# Patient Record
Sex: Male | Born: 1970 | Race: White | Hispanic: No | Smoking: Never smoker
Health system: Southern US, Community
[De-identification: ages and names within clinical notes are randomized; demographics above are authoritative.]

## PROBLEM LIST (undated history)

## (undated) DIAGNOSIS — F909 Attention-deficit hyperactivity disorder, unspecified type: Secondary | ICD-10-CM

## (undated) DIAGNOSIS — R55 Syncope and collapse: Secondary | ICD-10-CM

## (undated) DIAGNOSIS — I471 Supraventricular tachycardia, unspecified: Secondary | ICD-10-CM

## (undated) HISTORY — PX: HERNIA REPAIR: SHX51

---

## 1998-06-06 HISTORY — PX: KNEE SURGERY: SHX244

## 2010-06-06 HISTORY — PX: CARDIAC ELECTROPHYSIOLOGY STUDY AND ABLATION: SHX1294

## 2016-07-06 ENCOUNTER — Ambulatory Visit (INDEPENDENT_AMBULATORY_CARE_PROVIDER_SITE_OTHER): Payer: Commercial Managed Care - PPO | Admitting: Physician Assistant

## 2016-07-06 ENCOUNTER — Encounter: Payer: Self-pay | Admitting: Physician Assistant

## 2016-07-06 VITALS — BP 117/64 | HR 97 | Ht 72.0 in | Wt 193.0 lb

## 2016-07-06 DIAGNOSIS — D171 Benign lipomatous neoplasm of skin and subcutaneous tissue of trunk: Secondary | ICD-10-CM | POA: Insufficient documentation

## 2016-07-06 DIAGNOSIS — F902 Attention-deficit hyperactivity disorder, combined type: Secondary | ICD-10-CM | POA: Insufficient documentation

## 2016-07-06 DIAGNOSIS — Z8679 Personal history of other diseases of the circulatory system: Secondary | ICD-10-CM

## 2016-07-06 DIAGNOSIS — R002 Palpitations: Secondary | ICD-10-CM | POA: Diagnosis not present

## 2016-07-06 MED ORDER — NEBIVOLOL HCL 2.5 MG PO TABS: 2.5000 mg | ORAL_TABLET | Freq: Every day | ORAL | 2 refills | Status: DC

## 2016-07-06 MED ORDER — BUPROPION HCL ER (SR) 150 MG PO TB12: 150.0000 mg | ORAL_TABLET | Freq: Two times a day (BID) | ORAL | 2 refills | Status: DC

## 2016-07-06 NOTE — Progress Notes (Signed)
Subjective:    Patient ID: Jacob Cohen, male    DOB: 28-Jul-1970, 46 y.o.   MRN: [id number]  HPI  Pt is a 46 yo male who presents to the clinic to establish care.   .. Active Ambulatory Problems    Diagnosis Date Noted  . Lipoma of torso 07/06/2016  . ADHD (attention deficit hyperactivity disorder), combined type 07/06/2016  . Palpitations 07/06/2016  . History of PSVT (paroxysmal supraventricular tachycardia) 07/06/2016   Resolved Ambulatory Problems    Diagnosis Date Noted  . No Resolved Ambulatory Problems   No Additional Past Medical History   .Marland KitchenNo family history on file.  .. Social History   Social History  . Marital status: Single    Spouse name: N/A  . Number of children: N/A  . Years of education: N/A   Occupational History  . Not on file.   Social History Main Topics  . Smoking status: Never Smoker  . Smokeless tobacco: Current User    Types: Chew  . Alcohol use Yes  . Drug use: No  . Sexual activity: Yes   Other Topics Concern  . Not on file   Social History Narrative  . No narrative on file   Pt has hx of SVT in 2012 and had ablation done. He was started on beta blockers after surgery due to continuing to be symptomatic. Per pt not able to ablate every area needed due to being so close to SA node. He did not like the way BB made him feel and has not been on medication in 4 years. No CP. Has frequent episodes of palpitations.   Pt has long hx of ADHD. On ritalin as a child and before ablation. He needs help focusing. He is going through a divorce which is making it worse.   He has nodules on abdomen that have started to become tender. Seem to be growing. He has had some removed before on his arm.    Review of Systems  All other systems reviewed and are negative.      Objective:   Physical Exam  Constitutional: He is oriented to person, place, and time. He appears well-developed and well-nourished.  fidgity on exam.   HENT:  Head:  Normocephalic and atraumatic.  Cardiovascular: Normal rate, regular rhythm and normal heart sounds.   Pulmonary/Chest: Effort normal and breath sounds normal. He has no wheezes.  Abdominal:    Neurological: He is alert and oriented to person, place, and time.  Psychiatric: He has a normal mood and affect. His behavior is normal.          Assessment & Plan:  Marland KitchenMarland KitchenCristos was seen today for establish care.  Diagnoses and all orders for this visit:  History of PSVT (paroxysmal supraventricular tachycardia) -     EKG 12-Lead -     nebivolol (BYSTOLIC) 2.5 MG tablet; Take 1 tablet (2.5 mg total) by mouth daily.  Palpitations -     nebivolol (BYSTOLIC) 2.5 MG tablet; Take 1 tablet (2.5 mg total) by mouth daily.  ADHD (attention deficit hyperactivity disorder), combined type -     buPROPion (WELLBUTRIN SR) 150 MG 12 hr tablet; Take 1 tablet (150 mg total) by mouth 2 (two) times daily.  Lipoma of torso -     Ambulatory referral to General Surgery   EKG in office today showed NSR and no arrhthymias or PVC's.  Will start bystolic due to hx of intolerance of beta blockers. Follow up in 2 months.  Pt is not a candidate for stimulant due to hx of SVT. Discussed wellbutrin and strattera. He would like to try Wellbutrin first.   Due to areas becoming more painful will send to general surgery to consider removal.

## 2016-08-31 ENCOUNTER — Telehealth: Payer: Self-pay | Admitting: Physician Assistant

## 2016-08-31 ENCOUNTER — Encounter: Payer: Self-pay | Admitting: Physician Assistant

## 2016-08-31 ENCOUNTER — Ambulatory Visit (INDEPENDENT_AMBULATORY_CARE_PROVIDER_SITE_OTHER): Payer: Commercial Managed Care - PPO | Admitting: Physician Assistant

## 2016-08-31 VITALS — BP 113/69 | HR 78 | Ht 72.0 in | Wt 193.0 lb

## 2016-08-31 DIAGNOSIS — R002 Palpitations: Secondary | ICD-10-CM

## 2016-08-31 DIAGNOSIS — Z131 Encounter for screening for diabetes mellitus: Secondary | ICD-10-CM

## 2016-08-31 DIAGNOSIS — Z8679 Personal history of other diseases of the circulatory system: Secondary | ICD-10-CM | POA: Diagnosis not present

## 2016-08-31 DIAGNOSIS — F902 Attention-deficit hyperactivity disorder, combined type: Secondary | ICD-10-CM | POA: Diagnosis not present

## 2016-08-31 DIAGNOSIS — Z13220 Encounter for screening for lipoid disorders: Secondary | ICD-10-CM

## 2016-08-31 MED ORDER — NEBIVOLOL HCL 2.5 MG PO TABS: 2.5000 mg | ORAL_TABLET | Freq: Every day | ORAL | 3 refills | Status: DC

## 2016-08-31 NOTE — Progress Notes (Signed)
   Subjective:    Patient ID: Jacob Cohen, male    DOB: 05-Aug-1970, 46 y.o.   MRN: [id number]  HPI  Pt is a 46 yo male who presents to the clinic for follow up on medications.   PSVT-started on bystolic and tolerating well. He has a "few flutters" once a week or so. He drinks about 1 cup of caffiene a day. He has not noticed any side effects.   ADHD- wellbutrin did not help at all for focus and made him more irritable. He would like to stop. Focus continues to be a big problem for him.    Review of Systems  All other systems reviewed and are negative.      Objective:   Physical Exam  Constitutional: He is oriented to person, place, and time. He appears well-developed and well-nourished.  HENT:  Head: Normocephalic and atraumatic.  Cardiovascular: Normal rate, regular rhythm and normal heart sounds.   Pulmonary/Chest: Effort normal and breath sounds normal.  Neurological: He is alert and oriented to person, place, and time.  Psychiatric: He has a normal mood and affect. His behavior is normal.          Assessment & Plan:  Marland KitchenMarland KitchenDiagnoses and all orders for this visit:  Screening for lipid disorders -     Lipid panel  History of PSVT (paroxysmal supraventricular tachycardia) -     nebivolol (BYSTOLIC) 2.5 MG tablet; Take 1 tablet (2.5 mg total) by mouth daily.  Palpitations -     nebivolol (BYSTOLIC) 2.5 MG tablet; Take 1 tablet (2.5 mg total) by mouth daily.  Screening for diabetes mellitus -     COMPLETE METABOLIC PANEL WITH GFR  ADHD (attention deficit hyperactivity disorder), combined type   Refilled bystolic for 1 year.  Ordered screening labs.   Stop wellbutrin since not helping ADHD and likely causing agitation.   Discussed he really is not a candidate for stimulant. Will try stratterra. Will call rep and see if starter pack is available. If not will send in strattera. Encouraged to look online for coupon discussed side effects.

## 2016-08-31 NOTE — Telephone Encounter (Signed)
Can we please call strattera rep and see if they still give samples of starter packs? If they do please see if we can get one for patient if they do not then send this in to pharmacy.   Strattera 40mg  qam for 7 days then 2 tablets (80mg ) qam. #60   Pt will need to follow up in 1 month if starts strattera.

## 2016-09-01 ENCOUNTER — Other Ambulatory Visit: Payer: Self-pay

## 2016-09-01 LAB — LIPID PANEL
CHOLESTEROL: 189 mg/dL (ref <200)
HDL: 36 mg/dL — ABNORMAL LOW (ref >40)
LDL Cholesterol: 128 mg/dL — ABNORMAL HIGH (ref <100)
Total CHOL/HDL Ratio: 5.3 Ratio — ABNORMAL HIGH (ref <5.0)
Triglycerides: 123 mg/dL (ref <150)
VLDL: 25 mg/dL (ref <30)

## 2016-09-01 LAB — COMPLETE METABOLIC PANEL WITH GFR
ALBUMIN: 4.4 g/dL (ref 3.6–5.1)
ALK PHOS: 50 U/L (ref 40–115)
ALT: 31 U/L (ref 9–46)
AST: 26 U/L (ref 10–40)
BUN: 19 mg/dL (ref 7–25)
CALCIUM: 9.3 mg/dL (ref 8.6–10.3)
CO2: 24 mmol/L (ref 20–31)
CREATININE: 1.17 mg/dL (ref 0.60–1.35)
Chloride: 104 mmol/L (ref 98–110)
GFR, Est African American: 87 mL/min (ref >=60)
GFR, Est Non African American: 75 mL/min (ref >=60)
GLUCOSE: 102 mg/dL — AB (ref 65–99)
Potassium: 4 mmol/L (ref 3.5–5.3)
SODIUM: 139 mmol/L (ref 135–146)
Total Bilirubin: 0.8 mg/dL (ref 0.2–1.2)
Total Protein: 6.9 g/dL (ref 6.1–8.1)

## 2016-09-01 MED ORDER — ATOMOXETINE HCL 40 MG PO CAPS
40.0000 mg | ORAL_CAPSULE | Freq: Every day | ORAL | 0 refills | Status: DC
Start: 2016-09-01 — End: 2017-08-23

## 2016-09-01 NOTE — Telephone Encounter (Signed)
Script put in your box for signature.

## 2016-09-01 NOTE — Progress Notes (Unsigned)
strett

## 2016-09-01 NOTE — Telephone Encounter (Signed)
Notified patient.  Script sent.

## 2016-09-01 NOTE — Telephone Encounter (Signed)
Jacob Cohen from [facility] called, there is no one actively promoting Strattera at this point.  It went off patent last year.

## 2016-09-01 NOTE — Telephone Encounter (Signed)
Ok please make patient aware and then send in rx. Follow up in 1 month to see how working.

## 2017-08-21 ENCOUNTER — Ambulatory Visit: Payer: Commercial Managed Care - PPO | Admitting: Physician Assistant

## 2017-08-23 ENCOUNTER — Ambulatory Visit (INDEPENDENT_AMBULATORY_CARE_PROVIDER_SITE_OTHER): Payer: BLUE CROSS/BLUE SHIELD | Admitting: Physician Assistant

## 2017-08-23 ENCOUNTER — Encounter: Payer: Self-pay | Admitting: Physician Assistant

## 2017-08-23 VITALS — BP 130/78 | HR 86 | Ht 72.0 in | Wt 189.0 lb

## 2017-08-23 DIAGNOSIS — F902 Attention-deficit hyperactivity disorder, combined type: Secondary | ICD-10-CM | POA: Diagnosis not present

## 2017-08-23 DIAGNOSIS — Z8679 Personal history of other diseases of the circulatory system: Secondary | ICD-10-CM | POA: Diagnosis not present

## 2017-08-23 DIAGNOSIS — Z9889 Other specified postprocedural states: Secondary | ICD-10-CM | POA: Diagnosis not present

## 2017-08-23 NOTE — Progress Notes (Signed)
   Subjective:    Patient ID: Jacob Cohen, male    DOB: 03-Oct-1970, 47 y.o.   MRN: [id number]  HPI Jacob Cohen is a 47 year old male who presents today to discuss medication changes for ADHD. He has been treated since adolescence for ADHD with stimulants.  Patient has tried Welbutrin and Oncologist with no improvement in symptoms. He was last prescribed Strattera about 1 year ago. He took it for approximately 1 month and d/c it himself as he was having no improvement. Over the past 11 months he has "just been dealing with it". He states that he took one of his brothers adderall 20mg  tablets last week and he had an extremely productive day. He has a history of PSVT with ablation in 2012. He has occasional irregular beats (maybe 1 each month or less) that do not occur with exercise or stress. He denies any chest pain, shortness of breath, blurry vision, headaches. He does not regularly check his blood pressure and last time he checked it was a couple months ago and it was ~110/66 (per patient). He was previously on bystolic but stopped it because he thought it was helping and he feels no different on beta blocker than off.   .. Active Ambulatory Problems    Diagnosis Date Noted  . Lipoma of torso 07/06/2016  . ADHD (attention deficit hyperactivity disorder), combined type 07/06/2016  . Palpitations 07/06/2016  . History of PSVT (paroxysmal supraventricular tachycardia) 07/06/2016   Resolved Ambulatory Problems    Diagnosis Date Noted  . No Resolved Ambulatory Problems   No Additional Past Medical History      Review of Systems  Constitutional: Negative for chills and fever.  Respiratory: Negative for chest tightness and shortness of breath.   Cardiovascular: Negative for chest pain and palpitations.  Gastrointestinal: Negative for abdominal pain, nausea and vomiting.  Neurological: Negative for dizziness, syncope, [name] and headaches.       Objective:   Physical Exam   Constitutional: He is oriented to person, place, and time. He appears well-developed and well-nourished.  HENT:  Head: Normocephalic and atraumatic.  Eyes: Conjunctivae are normal.  Cardiovascular: Normal rate, regular rhythm and normal heart sounds.  Neurological: He is alert and oriented to person, place, and time.  Skin: Skin is warm and dry.  Psychiatric: He has a normal mood and affect. His behavior is normal.          Assessment & Plan:  Marland KitchenMarland KitchenGarlen was seen today for adhd.  Diagnoses and all orders for this visit:  ADHD (attention deficit hyperactivity disorder), combined type  History of PSVT (paroxysmal supraventricular tachycardia) -     Ambulatory referral to Cardiology  S/P ablation operation for arrhythmia -     Ambulatory referral to Cardiology   Pt is wanting to start adderall. I would like to get second opinion by cardiology first. Referral to be made. Pt has had ablation and not on beta blocker and not symptomatic. I think would be ok to start at 10mg  daily and see how it goes. Will defer to cardiology for decision. BP on 2nd recheck much better. Would like to see a few weeks of BP logs to make sure BP's look good on a regular basis. Follow up after starting adderall.

## 2017-08-23 NOTE — Progress Notes (Deleted)
   Subjective:    Patient ID: Jacob Cohen, male    DOB: April 27, 1971, 47 y.o.   MRN: [id number]  HPI  ASAP cardiology.  Keep BP.   Review of Systems     Objective:   Physical Exam        Assessment & Plan:

## 2017-09-13 ENCOUNTER — Encounter: Payer: Self-pay | Admitting: Physician Assistant

## 2017-09-13 ENCOUNTER — Ambulatory Visit (INDEPENDENT_AMBULATORY_CARE_PROVIDER_SITE_OTHER): Payer: BLUE CROSS/BLUE SHIELD | Admitting: Physician Assistant

## 2017-09-13 VITALS — BP 120/90 | HR 73 | Ht 72.0 in | Wt 190.8 lb

## 2017-09-13 DIAGNOSIS — I519 Heart disease, unspecified: Secondary | ICD-10-CM | POA: Insufficient documentation

## 2017-09-13 DIAGNOSIS — E785 Hyperlipidemia, unspecified: Secondary | ICD-10-CM

## 2017-09-13 DIAGNOSIS — Z8679 Personal history of other diseases of the circulatory system: Secondary | ICD-10-CM | POA: Diagnosis not present

## 2017-09-13 DIAGNOSIS — F909 Attention-deficit hyperactivity disorder, unspecified type: Secondary | ICD-10-CM | POA: Diagnosis not present

## 2017-09-13 LAB — TSH: TSH: 1.32 u[IU]/mL (ref 0.450–4.500)

## 2017-09-13 NOTE — Patient Instructions (Signed)
Medication Instructions:  Your physician recommends that you continue on your current medications as directed. Please refer to the Current Medication list given to you today.  Labwork: Your physician recommends that you return for lab work in: TODAY-TSH  Testing/Procedures: NONE   Follow-Up: Follow up as needed  Any Other Special Instructions Will Be Listed Below (If Applicable). If you need a refill on your cardiac medications before your next appointment, please call your pharmacy.

## 2017-09-13 NOTE — Progress Notes (Signed)
Cardiology Office Note    Date:  09/13/2017   ID:  Jacob Cohen, DOB [birth year], MRN [id number]  PCP:  Donella Stade, PA-C  Cardiologist:  New- Case discussed with Dr. Quay Burow   Chief Complaint  Patient presents with  . New Patient (Initial Visit)    cardiac clearance to start on Adderall. Patient referred by Iran Planas PA-C    History of Present Illness:  Jacob Cohen is a pleasant 47 y.o. male with PMH of ADHD and h/o PSVT s/p ablation 2012.  According to the patient, he was very symptomatic with PSVT in the past and had a few episodes of presyncope associated with fast heart rate.  He had the recurrent PSVT for 1.5 years before he sought ablation in 2012 at [facility]. Afterward, he did have a few recurrence of the symptoms, he was initially placed on metoprolol, he later discontinue the metoprolol due to the side effect of fatigue.  Nowaday, he only feels occasional fluttering sensation once every few months and they would only last about a second.  He is no longer having severe dizziness with recurrent episode.  He denies any chest pain, he works about 6 days a week and run up to 25 miles per week.  He denies any exertional shortness of breath either.  On physical exam, he does not have any obvious heart murmur, he does not have any lower extremity edema, orthopnea or PND.  Apparently he has failed Ritalin in the past, however he recently tried Adderall which was working very well for him.   I have discussed his case with DOD Dr. Donnella Bi, given the fact that ADHD is interfering with his daily life, and Adderall seems to help, our recommendation is for him to start on Adderall and if it does causes more PSVT in the future, cardiology would be available to help manage the PSVT.  No additional cardiology workup is needed in this case.  I will however obtain a TSH to check his thyroid as hyperthyroidism can cause more PSVT.  Otherwise recent lab work does show  that his cholesterol is mildly elevated, however given how much he exercises on a weekly basis.  I would recommend continued diet and exercise for now instead of initiating statins.    Past Medical History:  Diagnosis Date  . Heart disease     Past Surgical History:  Procedure Laterality Date  . CARDIAC ELECTROPHYSIOLOGY STUDY AND ABLATION  2012  . HERNIA REPAIR    . KNEE SURGERY Left 2000    Current Medications: Outpatient Medications Prior to Visit  Medication Sig Dispense Refill  . Ascorbic Acid (VITAMIN C PO) Take by mouth.    Marland Kitchen aspirin EC 81 MG tablet Take 81 mg by mouth daily.    . Multiple Vitamins-Minerals (ZINC PO) Take by mouth.     No facility-administered medications prior to visit.      Allergies:   Patient has no known allergies.   Social History   Socioeconomic History  . Marital status: Single    Spouse name: Not on file  . Number of children: Not on file  . Years of education: Not on file  . Highest education level: Not on file  Occupational History  . Not on file  Social Needs  . Financial resource strain: Not on file  . Food insecurity:    Worry: Not on file    Inability: Not on file  . Transportation needs:  Medical: Not on file    Non-medical: Not on file  Tobacco Use  . Smoking status: Never Smoker  . Smokeless tobacco: Current User    Types: Chew  Substance and Sexual Activity  . Alcohol use: Yes    Comment: 2-4 beers on the weekend  . Drug use: No  . Sexual activity: Yes  Lifestyle  . Physical activity:    Days per week: Not on file    Minutes per session: Not on file  . Stress: Not on file  Relationships  . Social connections:    Talks on phone: Not on file    Gets together: Not on file    Attends religious service: Not on file    Active member of club or organization: Not on file    Attends meetings of clubs or organizations: Not on file    Relationship status: Not on file  Other Topics Concern  . Not on file  Social  History Narrative  . Not on file     Family History:  The patient's family history includes Alzheimer's disease in his maternal grandfather; COPD in his paternal grandfather; Cancer in his maternal grandmother.   ROS:   Please see the history of present illness.    ROS All other systems reviewed and are negative.   PHYSICAL EXAM:   VS:  BP 120/90 (BP Location: Right Arm)   Pulse 73   Ht 6' (1.829 m)   Wt 190 lb 12.8 oz (86.5 kg)   BMI 25.88 kg/m    GEN: Well nourished, well developed, in no acute distress  HEENT: normal  Neck: no JVD, carotid bruits, or masses Cardiac: RRR; no murmurs, rubs, or gallops,no edema  Respiratory:  clear to auscultation bilaterally, normal work of breathing GI: soft, nontender, nondistended, + BS MS: no deformity or atrophy  Skin: warm and dry, no rash Neuro:  Alert and Oriented x 3, Strength and sensation are intact Psych: euthymic mood, full affect  Wt Readings from Last 3 Encounters:  09/13/17 190 lb 12.8 oz (86.5 kg)  08/23/17 189 lb (85.7 kg)  08/31/16 193 lb (87.5 kg)      Studies/Labs Reviewed:   EKG:  EKG is ordered today.  The ekg ordered today demonstrates normal sinus rhythm without significant ST-T wave changes  Recent Labs: No results found for requested labs within last 8760 hours.   Lipid Panel    Component Value Date/Time   CHOL 189 08/31/2016 1005   TRIG 123 08/31/2016 1005   HDL 36 (L) 08/31/2016 1005   CHOLHDL 5.3 (H) 08/31/2016 1005   VLDL 25 08/31/2016 1005   LDLCALC 128 (H) 08/31/2016 1005    Additional studies/ records that were reviewed today include:   N/A   ASSESSMENT:    1. History of PSVT (paroxysmal supraventricular tachycardia)   2. Attention deficit hyperactivity disorder (ADHD), unspecified ADHD type   3. Hyperlipidemia, unspecified hyperlipidemia type      PLAN:  In order of problems listed above:  1. History of PSVT s/p ablation at the [facility] in 2012: Patient only has  very rare recurrence of palpitation, and if the episodes only last about a second.  From cardiology standpoint, if Adderall is giving him more benefit to manage his ADHD, there is no contraindication from cardiology side.  Since Adderall is a stimulant, there is a probability it will increase recurrence of PSVT.  Cardiology will be available if PSVT does become more frequent.  I will  obtain TSH today to check his thyroid.  Right now he drink up to 4 beers every weekend, increased alcohol consumption sometimes can also increase probability of recurrent PSVT.  If he does have increased recurrence, I would recommend cut back on alcohol and potentially use low dose diltiazem CD to help manage this symptom.  Note the patient had side effect with beta-blocker before.  2. ADHD: Will defer to primary care service, known contraindication from cardiology perspective to start on Adderall if needed.  3. Hyperlipidemia: Seen on the recent lab work, LDL was 128, HDL 36.  Unclear why his HDL is this low despite the patient says he ran out to 25 miles per week.  We will hold off on initiating statins, encourage diet and exercise in this young patient who does not have significant history of coronary artery disease.    Medication Adjustments/Labs and Tests Ordered: Current medicines are reviewed at length with the patient today.  Concerns regarding medicines are outlined above.  Medication changes, Labs and Tests ordered today are listed in the Patient Instructions below. Patient Instructions  Medication Instructions:  Your physician recommends that you continue on your current medications as directed. Please refer to the Current Medication list given to you today.  Labwork: Your physician recommends that you return for lab work in: TODAY-TSH  Testing/Procedures: NONE   Follow-Up: Follow up as needed  Any Other Special Instructions Will Be Listed Below (If Applicable). If you need a refill on your cardiac  medications before your next appointment, please call your pharmacy.     Hilbert Corrigan, [place]  09/13/2017 10:09 AM    [address], [place], [place]  [address] Phone: [phone]; Fax: [phone]

## 2017-09-14 ENCOUNTER — Other Ambulatory Visit: Payer: Self-pay | Admitting: Physician Assistant

## 2017-09-14 ENCOUNTER — Telehealth: Payer: Self-pay | Admitting: Physician Assistant

## 2017-09-14 MED ORDER — AMPHETAMINE-DEXTROAMPHET ER 20 MG PO CP24: 20.0000 mg | ORAL_CAPSULE | ORAL | 0 refills | Status: DC

## 2017-09-14 NOTE — Telephone Encounter (Signed)
Patient was seen by cardiology yesterday and is inquiring about starting the med you and he discussed at his last appointment. He would like to start with the 20mg . Please advise. Thanks!

## 2017-09-14 NOTE — Telephone Encounter (Signed)
OK sent! Follow up in 1 month for recheck.

## 2017-09-15 ENCOUNTER — Telehealth: Payer: Self-pay | Admitting: Physician Assistant

## 2017-09-15 NOTE — Telephone Encounter (Signed)
New Message:     Pt returning call for results of labs

## 2017-09-15 NOTE — Telephone Encounter (Signed)
Follow up ° ° °Patient is returning call for results. °

## 2017-09-15 NOTE — Telephone Encounter (Signed)
Patient aware and verbalized understanding. °

## 2017-09-15 NOTE — Telephone Encounter (Signed)
Received fax from [place] that Adderall  was approved from 09/15/2017 through 09/16/2018. Pharmacy notified and forms sent to scan.   Reference ID: [id number].

## 2017-10-17 ENCOUNTER — Other Ambulatory Visit: Payer: Self-pay

## 2017-10-17 MED ORDER — AMPHETAMINE-DEXTROAMPHET ER 20 MG PO CP24: 20.0000 mg | ORAL_CAPSULE | ORAL | 0 refills | Status: DC

## 2017-10-17 NOTE — Telephone Encounter (Signed)
PT needs a refill on Aderrall he's out of it as of today

## 2017-10-18 ENCOUNTER — Ambulatory Visit (INDEPENDENT_AMBULATORY_CARE_PROVIDER_SITE_OTHER): Payer: BLUE CROSS/BLUE SHIELD | Admitting: Physician Assistant

## 2017-10-18 ENCOUNTER — Encounter: Payer: Self-pay | Admitting: Physician Assistant

## 2017-10-18 VITALS — BP 119/62 | HR 86 | Ht 72.0 in | Wt 181.0 lb

## 2017-10-18 DIAGNOSIS — F902 Attention-deficit hyperactivity disorder, combined type: Secondary | ICD-10-CM | POA: Diagnosis not present

## 2017-10-18 MED ORDER — AMPHETAMINE-DEXTROAMPHET ER 30 MG PO CP24: 30.0000 mg | ORAL_CAPSULE | ORAL | 0 refills | Status: DC

## 2017-10-18 NOTE — Progress Notes (Signed)
   Subjective:    Patient ID: Jacob Cohen, male    DOB: July 31, 1970, 47 y.o.   MRN: [id number]  HPI Pt is a 47 yo male who presents to the clinic for ADHD.   Patient has noticed a ton of benefit starting Adderall.  He denies any increase in palpitations, headaches, anxiety, insomnia.  He still feels like it only last about 5 hours.  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  He wishes to have a little more extended release.  .. Active Ambulatory Problems    Diagnosis Date Noted  . Lipoma of torso 07/06/2016  . ADHD (attention deficit hyperactivity disorder), combined type 07/06/2016  . Palpitations 07/06/2016  . History of PSVT (paroxysmal supraventricular tachycardia) 07/06/2016  . Heart disease    Resolved Ambulatory Problems    Diagnosis Date Noted  . No Resolved Ambulatory Problems   Past Medical History:  Diagnosis Date  . Heart disease       Review of Systems  All other systems reviewed and are negative.      Objective:   Physical Exam  Constitutional: He is oriented to person, place, and time. He appears well-developed and well-nourished.  HENT:  Head: Normocephalic and atraumatic.  Cardiovascular: Normal rate and regular rhythm.  Pulmonary/Chest: Effort normal and breath sounds normal.  Neurological: He is alert and oriented to person, place, and time.  Psychiatric: He has a normal mood and affect. His behavior is normal.          Assessment & Plan:  Marland KitchenMarland KitchenLamon was seen today for adhd.  Diagnoses and all orders for this visit:  ADHD (attention deficit hyperactivity disorder), combined type -     amphetamine-dextroamphetamine (ADDERALL XR) 30 MG 24 hr capsule; Take 1 capsule (30 mg total) by mouth every morning.   Increase to 30 mg.  Discussed he could go up to 60 mg if needed.  I would rather keep him on extended release today and add a afternoon immediate release.  Let me know via my chart in 1 month how he is doing on this current dose and  if he needs refills the same or if we need to change medication a little.

## 2017-11-16 ENCOUNTER — Telehealth: Payer: Self-pay

## 2017-11-16 DIAGNOSIS — F902 Attention-deficit hyperactivity disorder, combined type: Secondary | ICD-10-CM

## 2017-11-16 NOTE — Telephone Encounter (Signed)
PT came in office and asked for a refill on Adderall he would like to go up to 40mg . Pharmacy on file is correct.

## 2017-11-17 MED ORDER — AMPHETAMINE-DEXTROAMPHET ER 30 MG PO CP24: 30.0000 mg | ORAL_CAPSULE | ORAL | 0 refills | Status: DC

## 2017-11-17 MED ORDER — AMPHETAMINE-DEXTROAMPHET ER 10 MG PO CP24: 10.0000 mg | ORAL_CAPSULE | Freq: Every day | ORAL | 0 refills | Status: DC

## 2017-11-17 NOTE — Telephone Encounter (Signed)
Advise patient that I sent 30mg  XR and 60mXR to equal 40 since there is no 40mg  XR.

## 2017-11-17 NOTE — Telephone Encounter (Signed)
Pt advised. No further needs at this time 

## 2017-12-08 ENCOUNTER — Ambulatory Visit (INDEPENDENT_AMBULATORY_CARE_PROVIDER_SITE_OTHER): Payer: BLUE CROSS/BLUE SHIELD | Admitting: Physician Assistant

## 2017-12-08 ENCOUNTER — Encounter: Payer: Self-pay | Admitting: Physician Assistant

## 2017-12-08 VITALS — BP 145/82 | HR 70 | Ht 72.01 in | Wt 180.0 lb

## 2017-12-08 DIAGNOSIS — B360 Pityriasis versicolor: Secondary | ICD-10-CM | POA: Diagnosis not present

## 2017-12-08 DIAGNOSIS — F902 Attention-deficit hyperactivity disorder, combined type: Secondary | ICD-10-CM

## 2017-12-08 MED ORDER — KETOCONAZOLE 2 % EX CREA: 1.0000 "application " | TOPICAL_CREAM | Freq: Two times a day (BID) | CUTANEOUS | 1 refills | Status: DC

## 2017-12-08 MED ORDER — AMPHETAMINE-DEXTROAMPHET ER 30 MG PO CP24: 30.0000 mg | ORAL_CAPSULE | ORAL | 0 refills | Status: DC

## 2017-12-08 MED ORDER — FLUCONAZOLE 150 MG PO TABS: ORAL_TABLET | ORAL | 0 refills | Status: DC

## 2017-12-08 MED ORDER — AMPHETAMINE-DEXTROAMPHET ER 10 MG PO CP24: 10.0000 mg | ORAL_CAPSULE | Freq: Every day | ORAL | 0 refills | Status: DC

## 2017-12-08 NOTE — Patient Instructions (Addendum)
Tinea Versicolor Tinea versicolor is a common fungal infection of the skin. It causes a rash that appears as light or dark patches on the skin. The rash most often occurs on the chest, back, neck, or upper arms. This condition is more common during warm weather. Other than affecting how your skin looks, tinea versicolor usually does not cause other problems. In most cases, the infection goes away in a few weeks with treatment. It may take a few months for the patches on your skin to clear up. What are the causes? Tinea versicolor occurs when a type of fungus that is normally present on the skin starts to overgrow. This fungus is a kind of yeast. The exact cause of the overgrowth is not known. This condition cannot be passed from one person to another (noncontagious). What increases the risk? This condition is more likely to develop when certain factors are present, such as:  Heat and humidity.  Sweating too much.  Hormone changes.  Oily skin.  A weak defense (immune) system.  What are the signs or symptoms? Symptoms of this condition may include:  A rash on your skin that is made up of light or dark patches. The rash may have: ? Patches of tan or pink spots on light skin. ? Patches of white or brown spots on dark skin. ? Patches of skin that do not tan. ? Well-marked edges. ? Scales on the discolored areas.  Mild itching.  How is this diagnosed? A health care provider can usually diagnose this condition by looking at your skin. During the exam, he or she may use ultraviolet light to help determine the extent of the infection. In some cases, a skin sample may be taken by scraping the rash. This sample will be viewed under a microscope to check for yeast overgrowth. How is this treated? Treatment for this condition may include:  Dandruff shampoo that is applied to the affected skin during showers or bathing.  Over-the-counter medicated skin cream, lotion, or soaps.  Prescription  antifungal medicine in the form of skin cream or pills.  Medicine to help reduce itching.  Follow these instructions at home:  Take medicines only as directed by your health care provider.  Apply dandruff shampoo to the affected area if told to do so by your health care provider. You may be instructed to scrub the affected skin for several minutes each day.  Do not scratch the affected area of skin.  Avoid hot and humid conditions.  Do not use tanning booths.  Try to avoid sweating a lot. Contact a health care provider if:  Your symptoms get worse.  You have a fever.  You have redness, swelling, or pain at the site of your rash.  You have fluid, blood, or pus coming from your rash.  Your rash returns after treatment. This information is not intended to replace advice given to you by your health care provider. Make sure you discuss any questions you have with your health care provider. Document Released: 05/20/2000 Document Revised: 01/24/2016 Document Reviewed: 03/04/2014 Elsevier Interactive Patient Education  2018 [employer] versicolor is a skin infection that is caused by a type of yeast. It causes a rash that shows up as light or dark patches on the skin. It often occurs on the chest, back, neck, or upper arms. The condition usually does not cause other problems. In most cases, it goes away in a few weeks with treatment. The infection cannot be spread by  person to another person. Follow these instructions at home:  Take medicines only as told by your doctor.  Scrub your skin every day with a dandruff shampoo as told by your doctor.  Do not scratch your skin in the rash area.  Avoid places that are hot and humid.  Do not use tanning booths.  Try to avoid sweating a lot. Contact a doctor if:  Your symptoms get worse.  You have a fever.  You have redness, swelling, or pain in the area of your rash.  You have fluid, blood, or pus coming  from your rash.  Your rash comes back after treatment. This information is not intended to replace advice given to you by your health care provider. Make sure you discuss any questions you have with your health care provider. Document Released: 05/05/2008 Document Revised: 01/24/2016 Document Reviewed: 03/04/2014 Elsevier Interactive Patient Education  2018 [employer].

## 2017-12-08 NOTE — Progress Notes (Signed)
   Subjective:    Patient ID: Jacob Cohen, male    DOB: 20-Jul-1970, 47 y.o.   MRN: [id number]  HPI  Pt is a 47 yo male who presents to the clinic with rash that has felt like it has been coming and going for 9 months. Today he can still see the rash without being in a flare. A flare is caused by heat.when he golfs the rash is bad but goes away after taking shirt off and 20 minutes in aircondition. No pain or itching. Rash only on trunk and bilateral arm. Not on face or down legs. Pt got some OTC fungal cream and seemed to help a little.   Doing well on adderall. No problems or concerns. In need for refill.  .. Active Ambulatory Problems    Diagnosis Date Noted  . Lipoma of torso 07/06/2016  . ADHD (attention deficit hyperactivity disorder), combined type 07/06/2016  . Palpitations 07/06/2016  . History of PSVT (paroxysmal supraventricular tachycardia) 07/06/2016  . Heart disease   . Tinea versicolor 12/08/2017   Resolved Ambulatory Problems    Diagnosis Date Noted  . No Resolved Ambulatory Problems   Past Medical History:  Diagnosis Date  . Heart disease          Review of Systems See HPI.     Objective:   Physical Exam  Constitutional: He is oriented to person, place, and time. He appears well-developed and well-nourished.  HENT:  Head: Normocephalic and atraumatic.  Cardiovascular: Normal rate and regular rhythm.  Pulmonary/Chest: Effort normal and breath sounds normal.  Neurological: He is alert and oriented to person, place, and time.  Skin:  hyperpigmented rash of macules and patches with irregular borders and some had some central clearning located over his right flank area but scattered over entire upper/lower/mid back.   Psychiatric: He has a normal mood and affect. His behavior is normal.          Assessment & Plan:  Marland KitchenMarland KitchenTobin was seen today for rash.  Diagnoses and all orders for this visit:  Tinea versicolor -     fluconazole (DIFLUCAN) 150 MG  tablet; Take 2 tablets once a week for 4 weeks. -     ketoconazole (NIZORAL) 2 % cream; Apply 1 application topically 2 (two) times daily. To affected areas.  ADHD (attention deficit hyperactivity disorder), combined type -     amphetamine-dextroamphetamine (ADDERALL XR) 30 MG 24 hr capsule; Take 1 capsule (30 mg total) by mouth every morning. -     amphetamine-dextroamphetamine (ADDERALL XR) 10 MG 24 hr capsule; Take 1 capsule (10 mg total) by mouth daily.   Gave HO. From the rash I see today classic tinea. Discussed prevention. Given diflucan for 1 month at a weekly dose and ketoconazole cream. Follow up if no improvement.

## 2018-01-19 ENCOUNTER — Telehealth: Payer: Self-pay | Admitting: Physician Assistant

## 2018-01-19 DIAGNOSIS — F902 Attention-deficit hyperactivity disorder, combined type: Secondary | ICD-10-CM

## 2018-01-19 NOTE — Telephone Encounter (Signed)
PT came in requesting refill on medication (Adderall XR).   Please advise.

## 2018-01-22 ENCOUNTER — Other Ambulatory Visit: Payer: Self-pay | Admitting: Family Medicine

## 2018-01-22 DIAGNOSIS — F902 Attention-deficit hyperactivity disorder, combined type: Secondary | ICD-10-CM

## 2018-01-22 MED ORDER — AMPHETAMINE-DEXTROAMPHET ER 30 MG PO CP24: 30.0000 mg | ORAL_CAPSULE | ORAL | 0 refills | Status: DC

## 2018-01-22 MED ORDER — AMPHETAMINE-DEXTROAMPHET ER 10 MG PO CP24: 10.0000 mg | ORAL_CAPSULE | Freq: Every day | ORAL | 0 refills | Status: DC

## 2018-01-22 NOTE — Telephone Encounter (Signed)
Refill sent.

## 2018-01-22 NOTE — Addendum Note (Signed)
Addended by: Beatrice Lecher D on: 01/22/2018 10:25 AM   Modules accepted: Orders

## 2018-01-23 ENCOUNTER — Other Ambulatory Visit: Payer: Self-pay | Admitting: Family Medicine

## 2018-01-23 DIAGNOSIS — F902 Attention-deficit hyperactivity disorder, combined type: Secondary | ICD-10-CM

## 2018-01-23 MED ORDER — AMPHETAMINE-DEXTROAMPHET ER 30 MG PO CP24: 30.0000 mg | ORAL_CAPSULE | ORAL | 0 refills | Status: DC

## 2018-01-23 NOTE — Progress Notes (Signed)
Adderall XR are recent electronically as it was accidentally printed earlier today.

## 2018-02-15 ENCOUNTER — Encounter: Payer: Self-pay | Admitting: Physician Assistant

## 2018-02-15 DIAGNOSIS — F902 Attention-deficit hyperactivity disorder, combined type: Secondary | ICD-10-CM

## 2018-02-20 MED ORDER — AMPHETAMINE-DEXTROAMPHET ER 30 MG PO CP24: 30.0000 mg | ORAL_CAPSULE | ORAL | 0 refills | Status: DC

## 2018-02-20 MED ORDER — AMPHETAMINE-DEXTROAMPHETAMINE 30 MG PO TABS: ORAL_TABLET | ORAL | 0 refills | Status: DC

## 2018-02-21 DIAGNOSIS — R55 Syncope and collapse: Secondary | ICD-10-CM | POA: Diagnosis not present

## 2018-02-21 DIAGNOSIS — Z8679 Personal history of other diseases of the circulatory system: Secondary | ICD-10-CM | POA: Diagnosis not present

## 2018-02-21 DIAGNOSIS — R002 Palpitations: Secondary | ICD-10-CM | POA: Diagnosis not present

## 2018-02-21 DIAGNOSIS — I499 Cardiac arrhythmia, unspecified: Secondary | ICD-10-CM | POA: Diagnosis not present

## 2018-02-21 DIAGNOSIS — Z79899 Other long term (current) drug therapy: Secondary | ICD-10-CM | POA: Diagnosis not present

## 2018-02-21 DIAGNOSIS — I471 Supraventricular tachycardia: Secondary | ICD-10-CM | POA: Diagnosis not present

## 2018-02-21 DIAGNOSIS — F909 Attention-deficit hyperactivity disorder, unspecified type: Secondary | ICD-10-CM | POA: Diagnosis not present

## 2018-02-21 DIAGNOSIS — F17290 Nicotine dependence, other tobacco product, uncomplicated: Secondary | ICD-10-CM | POA: Diagnosis not present

## 2018-03-23 ENCOUNTER — Telehealth: Payer: Self-pay

## 2018-03-23 DIAGNOSIS — F902 Attention-deficit hyperactivity disorder, combined type: Secondary | ICD-10-CM

## 2018-03-23 DIAGNOSIS — I471 Supraventricular tachycardia: Secondary | ICD-10-CM | POA: Diagnosis not present

## 2018-03-23 DIAGNOSIS — R55 Syncope and collapse: Secondary | ICD-10-CM | POA: Diagnosis not present

## 2018-03-23 MED ORDER — AMPHETAMINE-DEXTROAMPHET ER 30 MG PO CP24: 30.0000 mg | ORAL_CAPSULE | ORAL | 0 refills | Status: DC

## 2018-03-23 NOTE — Telephone Encounter (Signed)
Done

## 2018-03-23 NOTE — Telephone Encounter (Signed)
PT came in to the office asking for a refill to be sent for his adderall. Pharmacy on file is correct.

## 2018-03-23 NOTE — Telephone Encounter (Signed)
Routing to covering Provider, refill due

## 2018-03-26 DIAGNOSIS — I493 Ventricular premature depolarization: Secondary | ICD-10-CM | POA: Diagnosis not present

## 2018-03-26 DIAGNOSIS — R000 Tachycardia, unspecified: Secondary | ICD-10-CM | POA: Diagnosis not present

## 2018-03-27 ENCOUNTER — Encounter: Payer: Self-pay | Admitting: Physician Assistant

## 2018-03-27 MED ORDER — AMPHETAMINE-DEXTROAMPHETAMINE 20 MG PO TABS: ORAL_TABLET | ORAL | 0 refills | Status: DC

## 2018-03-27 MED ORDER — AMPHETAMINE-DEXTROAMPHETAMINE 30 MG PO TABS: ORAL_TABLET | ORAL | 0 refills | Status: DC

## 2018-03-30 MED ORDER — METOPROLOL SUCCINATE ER 25 MG PO TB24: ORAL_TABLET | ORAL | 0 refills | Status: DC

## 2018-03-30 NOTE — Addendum Note (Signed)
Addended by: Donella Stade on: 03/30/2018 10:32 AM   Modules accepted: Orders

## 2018-04-21 ENCOUNTER — Other Ambulatory Visit: Payer: Self-pay | Admitting: Physician Assistant

## 2018-04-24 ENCOUNTER — Ambulatory Visit (INDEPENDENT_AMBULATORY_CARE_PROVIDER_SITE_OTHER): Payer: BLUE CROSS/BLUE SHIELD | Admitting: Physician Assistant

## 2018-04-24 ENCOUNTER — Encounter: Payer: Self-pay | Admitting: Physician Assistant

## 2018-04-24 ENCOUNTER — Other Ambulatory Visit: Payer: Self-pay | Admitting: Sports Medicine

## 2018-04-24 ENCOUNTER — Other Ambulatory Visit: Payer: Self-pay | Admitting: Physician Assistant

## 2018-04-24 VITALS — BP 129/80 | HR 101 | Ht 72.01 in | Wt 180.0 lb

## 2018-04-24 DIAGNOSIS — G8929 Other chronic pain: Secondary | ICD-10-CM | POA: Diagnosis not present

## 2018-04-24 DIAGNOSIS — F902 Attention-deficit hyperactivity disorder, combined type: Secondary | ICD-10-CM

## 2018-04-24 DIAGNOSIS — M7121 Synovial cyst of popliteal space [Baker], right knee: Secondary | ICD-10-CM | POA: Insufficient documentation

## 2018-04-24 DIAGNOSIS — M25561 Pain in right knee: Secondary | ICD-10-CM | POA: Diagnosis not present

## 2018-04-24 MED ORDER — AMPHETAMINE-DEXTROAMPHETAMINE 30 MG PO TABS: ORAL_TABLET | ORAL | 0 refills | Status: DC

## 2018-04-24 MED ORDER — AMPHETAMINE-DEXTROAMPHET ER 30 MG PO CP24: 30.0000 mg | ORAL_CAPSULE | ORAL | 0 refills | Status: DC

## 2018-04-24 NOTE — Progress Notes (Signed)
   Subjective:    Patient ID: Jacob Cohen, male    DOB: 1970/09/07, 47 y.o.   MRN: [id number]  HPI  Patient is a 47 year old male who presents to the clinic with right posterior knee pain since April 2019.  He denies any known trauma.  He is very active.  He has had many knee injuries in the past.  He feels like the lump behind his right knee is getting bigger.  Exercise and sitting for long periods of time tends to make it worse.  Ibuprofen helps minimally.  Pt has ADHD. He is doing well on current dose 30mg  XR and 30mg  IR. He denies any problems or concerns. He is doing well at work and with production.   .. Active Ambulatory Problems    Diagnosis Date Noted  . Lipoma of torso 07/06/2016  . ADHD (attention deficit hyperactivity disorder), combined type 07/06/2016  . Palpitations 07/06/2016  . History of PSVT (paroxysmal supraventricular tachycardia) 07/06/2016  . Heart disease   . Tinea versicolor 12/08/2017  . Baker's cyst, right 04/24/2018   Resolved Ambulatory Problems    Diagnosis Date Noted  . No Resolved Ambulatory Problems   No Additional Past Medical History      Review of Systems See HPI.     Objective:   Physical Exam  Constitutional: He is oriented to person, place, and time. He appears well-developed and well-nourished.  HENT:  Head: Normocephalic and atraumatic.  Cardiovascular: Normal rate and regular rhythm.  Pulmonary/Chest: Effort normal and breath sounds normal.  Musculoskeletal:  Right knee: No swelling or bruising. Firm posterior medial cyst of knee.  Mild tenderness to palpation of cyst.  NROM  Strength 5/5.   Neurological: He is alert and oriented to person, place, and time.  Psychiatric: He has a normal mood and affect.          Assessment & Plan:  Marland KitchenMarland KitchenDiagnoses and all orders for this visit:  Baker's cyst, right -     DG Knee Complete 4 Views Right; Future -     DG Knee 1-2 Views Left; Future  Chronic pain of right knee  ADHD  (attention deficit hyperactivity disorder), combined type -     amphetamine-dextroamphetamine (ADDERALL XR) 30 MG 24 hr capsule; Take 1 capsule (30 mg total) by mouth every morning. -     amphetamine-dextroamphetamine (ADDERALL) 30 MG tablet; Take at 2pm every day. -     amphetamine-dextroamphetamine (ADDERALL XR) 30 MG 24 hr capsule; Take 1 capsule (30 mg total) by mouth every morning. -     amphetamine-dextroamphetamine (ADDERALL XR) 30 MG 24 hr capsule; Take 1 capsule (30 mg total) by mouth every morning. -     amphetamine-dextroamphetamine (ADDERALL) 30 MG tablet; Take at 2pm. -     amphetamine-dextroamphetamine (ADDERALL) 30 MG tablet; Take at 2pm.   Consulted Dr. Darene Lamer for baker cyst removal and drainage. See note.   Adderall refilled for 3 months.   Follow up in 3 months.

## 2018-04-24 NOTE — Patient Instructions (Signed)
Baker Cyst A Baker cyst, also called a popliteal cyst, is a sac-like growth that forms at the back of the knee. The cyst forms when the fluid-filled sac (bursa) that cushions the knee joint becomes enlarged. The bursa that becomes a Baker cyst is located at the back of the knee joint. What are the causes? In most cases, a Baker cyst results from another knee problem that causes swelling inside the knee. This makes the fluid inside the knee joint (synovial fluid) flow into the bursa behind the knee, causing the bursa to enlarge. What increases the risk? You may be more likely to develop a Baker cyst if you already have a knee problem, such as:  A tear in cartilage that cushions the knee joint (meniscal tear).  A tear in the tissues that connect the bones of the knee joint (ligament tear).  Knee swelling from osteoarthritis, rheumatoid arthritis, or gout.  What are the signs or symptoms? A Baker cyst does not always cause symptoms. A lump behind the knee may be the only sign of the condition. The lump may be painful, especially when the knee is straightened. If the lump is painful, the pain may come and go. The knee may also be stiff. Symptoms may quickly get more severe if the cyst breaks open (ruptures). If your cyst ruptures, signs and symptoms may affect the knee and the back of the lower leg (calf) and may include:  Sudden or worsening pain.  Swelling.  Bruising.  How is this diagnosed? This condition may be diagnosed based on your symptoms and medical history. Your health care provider will also do a physical exam. This may include:  Feeling the cyst to check whether it is tender.  Checking your knee for signs of another knee condition that causes swelling.  You may have imaging tests, such as:  X-rays.  MRI.  Ultrasound.  How is this treated? A Baker cyst that is not painful may go away without treatment. If the cyst gets large or painful, it will likely get better if the  underlying knee problem is treated. Treatment for a Baker cyst may include:  Resting.  Keeping weight off of the knee. This means not leaning on the knee to support your body weight.  NSAIDs to reduce pain and swelling.  A procedure to drain the fluid from the cyst with a needle (aspiration). You may also get an injection of a medicine that reduces swelling (steroid).  Surgery. This may be needed if other treatments do not work. This usually involves correcting knee damage and removing the cyst.  Follow these instructions at home:  Take over-the-counter and prescription medicines only as told by your health care provider.  Rest and return to your normal activities as told by your health care provider. Avoid activities that make pain or swelling worse. Ask your health care provider what activities are safe for you.  Keep all follow-up visits as told by your health care provider. This is important. Contact a health care provider if:  You have knee pain, stiffness, or swelling that does not get better. Get help right away if:  You have sudden or worsening pain and swelling in your calf area. This information is not intended to replace advice given to you by your health care provider. Make sure you discuss any questions you have with your health care provider. Document Released: 05/23/2005 Document Revised: 02/11/2016 Document Reviewed: 02/11/2016 Elsevier Interactive Patient Education  2018 [employer]  

## 2018-04-24 NOTE — Progress Notes (Signed)
Subjective:    I'm seeing this patient as a consultation for: Jacob Planas, PA-C  CC: Right leg pain  HPI: This is a pleasant 47 year old male, ex-football player, multiple injuries, meniscal tears.  Recently he said increasing swelling behind his right knee, with a sense of fullness and minimal discomfort.  Mild, intermittent.  I reviewed the past medical history, family history, social history, surgical history, and allergies today and no changes were needed.  Please see the problem list section below in epic for further details.  Past Medical History: Past Medical History:  Diagnosis Date  . Heart disease    Past Surgical History: Past Surgical History:  Procedure Laterality Date  . CARDIAC ELECTROPHYSIOLOGY STUDY AND ABLATION  2012  . HERNIA REPAIR    . KNEE SURGERY Left 2000   Social History: Social History   Socioeconomic History  . Marital status: Single    Spouse name: Not on file  . Number of children: Not on file  . Years of education: Not on file  . Highest education level: Not on file  Occupational History  . Not on file  Social Needs  . Financial resource strain: Not on file  . Food insecurity:    Worry: Not on file    Inability: Not on file  . Transportation needs:    Medical: Not on file    Non-medical: Not on file  Tobacco Use  . Smoking status: Never Smoker  . Smokeless tobacco: Current User    Types: Chew  Substance and Sexual Activity  . Alcohol use: Yes    Comment: 2-4 beers on the weekend  . Drug use: No  . Sexual activity: Yes  Lifestyle  . Physical activity:    Days per week: Not on file    Minutes per session: Not on file  . Stress: Not on file  Relationships  . Social connections:    Talks on phone: Not on file    Gets together: Not on file    Attends religious service: Not on file    Active member of club or organization: Not on file    Attends meetings of clubs or organizations: Not on file    Relationship status: Not on  file  Other Topics Concern  . Not on file  Social History Narrative  . Not on file   Family History: Family History  Problem Relation Age of Onset  . Cancer Maternal Grandmother   . Alzheimer's disease Maternal Grandfather   . COPD Paternal Grandfather    Allergies: No Known Allergies Medications: See med rec.  Review of Systems: No headache, visual changes, nausea, vomiting, diarrhea, constipation, dizziness, abdominal pain, skin rash, fevers, chills, night sweats, weight loss, swollen lymph nodes, body aches, joint swelling, muscle aches, chest pain, shortness of breath, mood changes, visual or auditory hallucinations.   Objective:   General: Well Developed, well nourished, and in no acute distress.  Neuro:  Extra-ocular muscles intact, able to move all 4 extremities, sensation grossly intact.  Deep tendon reflexes tested were normal. Psych: Alert and oriented, mood congruent with affect. ENT:  Ears and nose appear unremarkable.  Hearing grossly normal. Neck: Unremarkable overall appearance, trachea midline.  No visible thyroid enlargement. Eyes: Conjunctivae and lids appear unremarkable.  Pupils equal and round. Skin: Warm and dry, no rashes noted.  Cardiovascular: Pulses palpable, no extremity edema. Right knee: Visible and palpable Baker's cyst at the semimembranosus/medial head of the gastrocnemius proximal Palpation normal with no warmth or joint  line tenderness or patellar tenderness or condyle tenderness. ROM normal in flexion and extension and lower leg rotation. Ligaments with solid consistent endpoints including ACL, PCL, LCL, MCL. Negative Mcmurray's and provocative meniscal tests. Non painful patellar compression. Patellar and quadriceps tendons unremarkable. Hamstring and quadriceps strength is normal.  Procedure: Real-time Ultrasound Guided aspiration/injection of right knee Baker's cyst Device: GE Logiq E  Verbal informed consent obtained.  Time-out  conducted.  Noted no overlying erythema, induration, or other signs of local infection.  Skin prepped in a sterile fashion.  Local anesthesia: Topical Ethyl chloride.  With sterile technique and under real time ultrasound guidance: Using an 18-gauge needle advanced into a large Baker's cyst and aspirated 9 cc of clear, straw-colored fluid, syringe switched and 1 cc Kenalog 40, 1 cc lidocaine injected easily. Completed without difficulty  Pain immediately resolved suggesting accurate placement of the medication.  Advised to call if fevers/chills, erythema, induration, drainage, or persistent bleeding.  Images permanently stored and available for review in the ultrasound unit.  Impression: Technically successful ultrasound guided injection.  Impression and Recommendations:   This case required medical decision making of moderate complexity.  Baker's cyst, right Aspiration and injection. X-rays.   Strapped with compressive dressing, return to see me in 1 month, further evaluation of the intra-articular structures if recurrence. ___________________________________________ Gwen Her. Dianah Field, M.D., ABFM., CAQSM. Primary Care and Sports Medicine [facility]  Adjunct Professor of [employer]

## 2018-04-24 NOTE — Assessment & Plan Note (Signed)
Aspiration and injection. X-rays.   Strapped with compressive dressing, return to see me in 1 month, further evaluation of the intra-articular structures if recurrence.

## 2018-05-11 ENCOUNTER — Encounter: Payer: Self-pay | Admitting: Physician Assistant

## 2018-05-11 DIAGNOSIS — F902 Attention-deficit hyperactivity disorder, combined type: Secondary | ICD-10-CM

## 2018-05-11 DIAGNOSIS — Z8679 Personal history of other diseases of the circulatory system: Secondary | ICD-10-CM

## 2018-05-11 DIAGNOSIS — Z9889 Other specified postprocedural states: Secondary | ICD-10-CM

## 2018-05-14 ENCOUNTER — Encounter: Payer: Self-pay | Admitting: Physician Assistant

## 2018-05-18 ENCOUNTER — Encounter: Payer: Self-pay | Admitting: Cardiology

## 2018-05-18 ENCOUNTER — Ambulatory Visit: Payer: BLUE CROSS/BLUE SHIELD | Admitting: Internal Medicine

## 2018-05-20 ENCOUNTER — Other Ambulatory Visit: Payer: Self-pay | Admitting: Physician Assistant

## 2018-05-20 DIAGNOSIS — F902 Attention-deficit hyperactivity disorder, combined type: Secondary | ICD-10-CM

## 2018-05-21 ENCOUNTER — Ambulatory Visit: Payer: BLUE CROSS/BLUE SHIELD | Admitting: Physician Assistant

## 2018-05-21 ENCOUNTER — Encounter: Payer: BLUE CROSS/BLUE SHIELD | Admitting: Sports Medicine

## 2018-05-21 ENCOUNTER — Encounter: Payer: Self-pay | Admitting: Cardiology

## 2018-05-21 ENCOUNTER — Ambulatory Visit (INDEPENDENT_AMBULATORY_CARE_PROVIDER_SITE_OTHER): Payer: BLUE CROSS/BLUE SHIELD | Admitting: Cardiology

## 2018-05-21 VITALS — BP 126/80 | HR 79 | Ht 72.0 in | Wt 181.0 lb

## 2018-05-21 DIAGNOSIS — I471 Supraventricular tachycardia: Secondary | ICD-10-CM | POA: Diagnosis not present

## 2018-05-21 NOTE — Telephone Encounter (Signed)
I sent 3 months at last visit in November. Call pharmacy to confirm.

## 2018-05-21 NOTE — Progress Notes (Signed)
Electrophysiology Office Note   Date:  05/21/2018   ID:  Jacob Cohen, DOB 21-May-1971, MRN [id number]  PCP:  Donella Stade, PA-C  Cardiologist: Alvester Chou Primary Electrophysiologist:  Will Meredith Leeds, MD    No chief complaint on file.    History of Present Illness: Jacob Cohen is a 47 y.o. male who is being seen today for the evaluation of SVT at the request of [place]. Presenting today for electrophysiology evaluation.  He has a history of ADHD and SVT status post ablation in 2012.  He was very symptomatic with his SVT in the past and had episodes of presyncope.  He continues to feel palpitations.  He recently presented to [facility] after an episode of palpitations.  He was laying down to go to sleep.  He got nauseous, stood up and had a syncopal episode.  He is continued to have palpitations since his ablation.  Palpitations occur a few times a week and are usually terminated by vagal maneuvers.    Today, he denies symptoms of palpitations, chest pain, shortness of breath, orthopnea, PND, lower extremity edema, claudication, dizziness, presyncope, syncope, bleeding, or neurologic sequela. The patient is tolerating medications without difficulties.    Past Medical History:  Diagnosis Date  . Heart disease    Past Surgical History:  Procedure Laterality Date  . CARDIAC ELECTROPHYSIOLOGY STUDY AND ABLATION  2012  . HERNIA REPAIR    . KNEE SURGERY Left 2000     Current Outpatient Medications  Medication Sig Dispense Refill  . [START ON 06/24/2018] amphetamine-dextroamphetamine (ADDERALL XR) 30 MG 24 hr capsule Take 1 capsule (30 mg total) by mouth every morning. 30 capsule 0  . Ascorbic Acid (VITAMIN C PO) Take by mouth.    Marland Kitchen aspirin EC 81 MG tablet Take 81 mg by mouth daily.    . fluconazole (DIFLUCAN) 150 MG tablet Take 2 tablets once a week for 4 weeks. 8 tablet 0  . ketoconazole (NIZORAL) 2 % cream Apply 1 application topically 2 (two) times daily. To affected  areas. 60 g 1  . metoprolol succinate (TOPROL-XL) 25 MG 24 hr tablet TAKE 1 TABLET BY MOUTH EVERY DAY 30 tablet 0  . Multiple Vitamins-Minerals (ZINC PO) Take by mouth.     No current facility-administered medications for this visit.     Allergies:   Patient has no known allergies.   Social History:  The patient  reports that he has never smoked. His smokeless tobacco use includes chew. He reports current alcohol use. He reports that he does not use drugs.   Family History:  The patient's family history includes Alzheimer's disease in his maternal grandfather; COPD in his paternal grandfather; Cancer in his maternal grandmother; Hypertension in his mother.    ROS:  Please see the history of present illness.   Otherwise, review of systems is positive for palpitations.   All other systems are reviewed and negative.    PHYSICAL EXAM: VS:  BP 126/80   Pulse 79   Ht 6' (1.829 m)   Wt 181 lb (82.1 kg)   BMI 24.55 kg/m  , BMI Body mass index is 24.55 kg/m. GEN: Well nourished, well developed, in no acute distress  HEENT: normal  Neck: no JVD, carotid bruits, or masses Cardiac: RRR; no murmurs, rubs, or gallops,no edema  Respiratory:  clear to auscultation bilaterally, normal work of breathing GI: soft, nontender, nondistended, + BS MS: no deformity or atrophy  Skin: warm and dry Neuro:  Strength and  sensation are intact Psych: euthymic mood, full affect  EKG:  EKG is ordered today. Personal review of the ekg ordered shows sinus rhythm, rate 79  Recent Labs: 09/13/2017: TSH 1.320    Lipid Panel     Component Value Date/Time   CHOL 189 08/31/2016 1005   TRIG 123 08/31/2016 1005   HDL 36 (L) 08/31/2016 1005   CHOLHDL 5.3 (H) 08/31/2016 1005   VLDL 25 08/31/2016 1005   LDLCALC 128 (H) 08/31/2016 1005     Wt Readings from Last 3 Encounters:  05/21/18 181 lb (82.1 kg)  04/24/18 180 lb (81.6 kg)  12/08/17 180 lb (81.6 kg)      Other studies Reviewed: Additional  studies/ records that were reviewed today include: TTE 02/21/18  Review of the above records today demonstrates:  The left ventricle is normal in size, wall thickness and wall motion with ejection fraction of 55-60%. The left ventricular diastolic function is normal. The aortic valve is a normal trileaflet valve with no regurgitation.    ASSESSMENT AND PLAN:  1.  SVT: Has had multiple episodes.  Did have an episode where he sounds like he got vagal and had syncope.  He currently feels well.  He has had an ablation 10 years ago in [place].  We will work to get the records from both his hospitalization at [place] as well as his ablation in [place].  I discussed with him the possibility of repeat ablation.  Risks and benefits were discussed and include bleeding, tamponade, heart block, and stroke.  He understands these risks and is agreed to the procedure.    Current medicines are reviewed at length with the patient today.   The patient does not have concerns regarding his medicines.  The following changes were made today:  none  Labs/ tests ordered today include:  Orders Placed This Encounter  Procedures  . EKG 12-Lead     Disposition:   FU with Will Camnitz 3 months  Signed, Will Meredith Leeds, MD  05/21/2018 12:45 PM     [address] [place] [address] [phone] (office) [phone] (fax)

## 2018-05-23 ENCOUNTER — Encounter: Payer: Self-pay | Admitting: Physician Assistant

## 2018-05-23 ENCOUNTER — Ambulatory Visit (INDEPENDENT_AMBULATORY_CARE_PROVIDER_SITE_OTHER): Payer: BLUE CROSS/BLUE SHIELD | Admitting: Physician Assistant

## 2018-05-23 ENCOUNTER — Encounter: Payer: BLUE CROSS/BLUE SHIELD | Admitting: Sports Medicine

## 2018-05-23 VITALS — BP 134/71 | HR 105 | Ht 72.0 in | Wt 179.0 lb

## 2018-05-23 DIAGNOSIS — R5383 Other fatigue: Secondary | ICD-10-CM

## 2018-05-23 DIAGNOSIS — Z13220 Encounter for screening for lipoid disorders: Secondary | ICD-10-CM

## 2018-05-23 DIAGNOSIS — R21 Rash and other nonspecific skin eruption: Secondary | ICD-10-CM | POA: Diagnosis not present

## 2018-05-23 DIAGNOSIS — M2550 Pain in unspecified joint: Secondary | ICD-10-CM | POA: Diagnosis not present

## 2018-05-23 DIAGNOSIS — F902 Attention-deficit hyperactivity disorder, combined type: Secondary | ICD-10-CM

## 2018-05-23 MED ORDER — AMPHETAMINE-DEXTROAMPHET ER 30 MG PO CP24: 30.0000 mg | ORAL_CAPSULE | ORAL | 0 refills | Status: DC

## 2018-05-23 MED ORDER — AMPHETAMINE-DEXTROAMPHET ER 10 MG PO CP24: 10.0000 mg | ORAL_CAPSULE | Freq: Every day | ORAL | 0 refills | Status: DC

## 2018-05-23 MED ORDER — AMPHETAMINE-DEXTROAMPHETAMINE 10 MG PO TABS: 10.0000 mg | ORAL_TABLET | Freq: Two times a day (BID) | ORAL | 0 refills | Status: DC

## 2018-05-23 NOTE — Progress Notes (Signed)
l °

## 2018-05-23 NOTE — Progress Notes (Signed)
Subjective:    Patient ID: Jacob Cohen, male    DOB: [birth year], 47 y.o.   MRN: [id number]  HPI Pt is a 47 yo male who presents to the clinic to follow up after a rash that concerned him or lupus over his cheeks and nasal bridge. He has been having aches and pains in joints for a while just thought it was from playing sports. He just does not have the energy he used to have. No known autoimmune hx in family.   Needs ADHD medication. Doing well. No concerns or complaints.   .. Family History  Problem Relation Age of Onset  . Hypertension Mother   . Cancer Maternal Grandmother   . Alzheimer's disease Maternal Grandfather   . COPD Paternal Grandfather       Review of Systems  All other systems reviewed and are negative.  See HPI>     Objective:   Physical Exam Vitals signs reviewed.  Constitutional:      Appearance: Normal appearance.  HENT:     Head: Normocephalic and atraumatic.  Cardiovascular:     Rate and Rhythm: Normal rate and regular rhythm.     Heart sounds: No murmur.  Pulmonary:     Effort: Pulmonary effort is normal.  Skin:    Comments: No rash today.   Neurological:     General: No focal deficit present.     Mental Status: He is alert and oriented to person, place, and time.  Psychiatric:        Mood and Affect: Mood normal.        Behavior: Behavior normal.           Assessment & Plan:  .Jacob Cohen was seen today for follow-up.  Diagnoses and all orders for this visit:  Rash -     Antinuclear Antib (ANA) -     COMPLETE METABOLIC PANEL WITH GFR -     CBC with Differential/Platelet -     B12 and Folate Panel -     Vitamin D 1,25 dihydroxy -     Cyclic citrul peptide antibody, IgG -     Lipid Panel w/reflex Direct LDL -     Sed Rate (ESR) -     C-reactive protein -     Lupus Anticoagulant Eval w/Reflex  Multiple joint pain -     Antinuclear Antib (ANA) -     COMPLETE METABOLIC PANEL WITH GFR -     CBC with Differential/Platelet -      B12 and Folate Panel -     Vitamin D 1,25 dihydroxy -     Cyclic citrul peptide antibody, IgG -     Lipid Panel w/reflex Direct LDL -     Sed Rate (ESR) -     C-reactive protein -     Lupus Anticoagulant Eval w/Reflex  No energy -     Antinuclear Antib (ANA) -     COMPLETE METABOLIC PANEL WITH GFR -     CBC with Differential/Platelet -     B12 and Folate Panel -     Vitamin D 1,25 dihydroxy -     Cyclic citrul peptide antibody, IgG -     Lipid Panel w/reflex Direct LDL -     Sed Rate (ESR) -     C-reactive protein -     Lupus Anticoagulant Eval w/Reflex  Screening for lipid disorders -     Lipid Panel w/reflex Direct LDL  ADHD (attention deficit hyperactivity  disorder), combined type -     amphetamine-dextroamphetamine (ADDERALL XR) 30 MG 24 hr capsule; Take 1 capsule (30 mg total) by mouth every morning.  Other orders -     Discontinue: amphetamine-dextroamphetamine (ADDERALL XR) 10 MG 24 hr capsule; Take 1 capsule (10 mg total) by mouth daily. -     amphetamine-dextroamphetamine (ADDERALL XR) 30 MG 24 hr capsule; Take 1 capsule (30 mg total) by mouth every morning. -     amphetamine-dextroamphetamine (ADDERALL XR) 30 MG 24 hr capsule; Take 1 capsule (30 mg total) by mouth every morning. -     Discontinue: amphetamine-dextroamphetamine (ADDERALL) 10 MG tablet; Take 1 tablet (10 mg total) by mouth 2 (two) times daily. -     Discontinue: amphetamine-dextroamphetamine (ADDERALL) 10 MG tablet; Take 1 tablet (10 mg total) by mouth 2 (two) times daily. -     Discontinue: amphetamine-dextroamphetamine (ADDERALL) 10 MG tablet; Take 1 tablet (10 mg total) by mouth 2 (two) times daily.   Sent ADHD medication refills.   Will get labs to evaluate for an autoimmune cause of rash/joint pain/no energy.    Addendum: much confusion but not to confirm pt is on 69m XR and 325mIR at 2pm

## 2018-05-24 ENCOUNTER — Telehealth: Payer: Self-pay

## 2018-05-24 NOTE — Telephone Encounter (Signed)
Jacob Cohen states yesterday he was prescribed Adderall 10 mg XR. He normally takes Adderall 10 mg bid. He would like to have the Adderall 10 mg bid. Please advise.

## 2018-05-25 MED ORDER — AMPHETAMINE-DEXTROAMPHETAMINE 10 MG PO TABS: 10.0000 mg | ORAL_TABLET | Freq: Two times a day (BID) | ORAL | 0 refills | Status: DC

## 2018-05-25 NOTE — Progress Notes (Signed)
I was trying to wait until all labs resulted.  Good news:  ANA negative.  ESR/CRP inflammatory labs negative.  Kidney, liver, glucose look good.  b12 looks great.  RA antibodies negative.   Waiting for lupus antibodies but suspect negative due to ANA being negative.   Cholesterol is not at goal. LDL 131. TG 278. I would start fish oil 4071m daily and watch high fatty foods recheck in 6 months.

## 2018-05-25 NOTE — Telephone Encounter (Signed)
Patient advised.

## 2018-05-25 NOTE — Telephone Encounter (Signed)
Ok that was the only one sent wrong. I sent 3 months of each. Have patient confirm that will pharmacy.

## 2018-05-26 LAB — CBC WITH DIFFERENTIAL/PLATELET
ABSOLUTE MONOCYTES: 439 {cells}/uL (ref 200–950)
BASOS PCT: 0.9 %
Basophils Absolute: 39 cells/uL (ref 0–200)
EOS ABS: 228 {cells}/uL (ref 15–500)
Eosinophils Relative: 5.3 %
HEMATOCRIT: 47.4 % (ref 38.5–50.0)
HEMOGLOBIN: 16.1 g/dL (ref 13.2–17.1)
Lymphs Abs: 1264 cells/uL (ref 850–3900)
MCH: 33.1 pg — AB (ref 27.0–33.0)
MCHC: 34 g/dL (ref 32.0–36.0)
MCV: 97.3 fL (ref 80.0–100.0)
MPV: 11 fL (ref 7.5–12.5)
Monocytes Relative: 10.2 %
NEUTROS ABS: 2331 {cells}/uL (ref 1500–7800)
Neutrophils Relative %: 54.2 %
Platelets: 224 10*3/uL (ref 140–400)
RBC: 4.87 10*6/uL (ref 4.20–5.80)
RDW: 12.4 % (ref 11.0–15.0)
Total Lymphocyte: 29.4 %
WBC: 4.3 10*3/uL (ref 3.8–10.8)

## 2018-05-26 LAB — ANA: ANA: NEGATIVE

## 2018-05-26 LAB — COMPLETE METABOLIC PANEL WITH GFR
AG Ratio: 2 (calc) (ref 1.0–2.5)
ALBUMIN MSPROF: 4.7 g/dL (ref 3.6–5.1)
ALT: 21 U/L (ref 9–46)
AST: 25 U/L (ref 10–40)
Alkaline phosphatase (APISO): 51 U/L (ref 40–115)
BUN: 16 mg/dL (ref 7–25)
CO2: 26 mmol/L (ref 20–32)
CREATININE: 1.25 mg/dL (ref 0.60–1.35)
Calcium: 9.9 mg/dL (ref 8.6–10.3)
Chloride: 103 mmol/L (ref 98–110)
GFR, EST NON AFRICAN AMERICAN: 69 mL/min/{1.73_m2} (ref >=60)
GFR, Est African American: 80 mL/min/{1.73_m2} (ref >=60)
GLOBULIN: 2.4 g/dL (ref 1.9–3.7)
Glucose, Bld: 99 mg/dL (ref 65–99)
Potassium: 4.3 mmol/L (ref 3.5–5.3)
SODIUM: 139 mmol/L (ref 135–146)
Total Bilirubin: 0.7 mg/dL (ref 0.2–1.2)
Total Protein: 7.1 g/dL (ref 6.1–8.1)

## 2018-05-26 LAB — LUPUS ANTICOAGULANT EVAL W/ REFLEX
PTT-LA Screen: 36 s (ref <=40)
dRVVT: 31 s (ref <=45)

## 2018-05-26 LAB — SEDIMENTATION RATE: SED RATE: 2 mm/h (ref 0–15)

## 2018-05-26 LAB — VITAMIN D 1,25 DIHYDROXY
Vitamin D 1, 25 (OH)2 Total: 40 pg/mL (ref 18–72)
Vitamin D2 1, 25 (OH)2: <8 pg/mL
Vitamin D3 1, 25 (OH)2: 40 pg/mL

## 2018-05-26 LAB — LIPID PANEL W/REFLEX DIRECT LDL
Cholesterol: 222 mg/dL — ABNORMAL HIGH (ref <200)
HDL: 48 mg/dL (ref >40)
LDL Cholesterol (Calc): 131 mg/dL (calc) — ABNORMAL HIGH
NON-HDL CHOLESTEROL (CALC): 174 mg/dL — AB (ref <130)
Total CHOL/HDL Ratio: 4.6 (calc) (ref <5.0)
Triglycerides: 278 mg/dL — ABNORMAL HIGH (ref <150)

## 2018-05-26 LAB — B12 AND FOLATE PANEL
Folate: 9.5 ng/mL
Vitamin B-12: 522 pg/mL (ref 200–1100)

## 2018-05-26 LAB — CYCLIC CITRUL PEPTIDE ANTIBODY, IGG: Cyclic Citrullin Peptide Ab: <16 UNITS

## 2018-05-26 LAB — C-REACTIVE PROTEIN: CRP: 0.5 mg/L (ref <8.0)

## 2018-05-28 ENCOUNTER — Ambulatory Visit (INDEPENDENT_AMBULATORY_CARE_PROVIDER_SITE_OTHER): Payer: BLUE CROSS/BLUE SHIELD

## 2018-05-28 ENCOUNTER — Encounter: Payer: Self-pay | Admitting: Sports Medicine

## 2018-05-28 ENCOUNTER — Other Ambulatory Visit: Payer: Self-pay | Admitting: Physician Assistant

## 2018-05-28 DIAGNOSIS — M1711 Unilateral primary osteoarthritis, right knee: Secondary | ICD-10-CM

## 2018-05-28 DIAGNOSIS — M7121 Synovial cyst of popliteal space [Baker], right knee: Secondary | ICD-10-CM

## 2018-05-28 DIAGNOSIS — Q741 Congenital malformation of knee: Secondary | ICD-10-CM

## 2018-05-28 DIAGNOSIS — M25562 Pain in left knee: Secondary | ICD-10-CM | POA: Diagnosis not present

## 2018-05-28 MED ORDER — AMPHETAMINE-DEXTROAMPHETAMINE 30 MG PO TABS: 30.0000 mg | ORAL_TABLET | Freq: Every day | ORAL | 0 refills | Status: DC

## 2018-05-28 NOTE — Progress Notes (Signed)
Wrong lab was ordered and was my fault. Can we get the lupus anticoagulant bill taken off his acct?

## 2018-05-31 DIAGNOSIS — R5383 Other fatigue: Secondary | ICD-10-CM

## 2018-05-31 NOTE — Progress Notes (Signed)
Pt has seen results on MyChart and message also sent for patient to call back if any questions.

## 2018-06-01 ENCOUNTER — Ambulatory Visit (INDEPENDENT_AMBULATORY_CARE_PROVIDER_SITE_OTHER): Payer: BLUE CROSS/BLUE SHIELD | Admitting: Sports Medicine

## 2018-06-01 ENCOUNTER — Encounter: Payer: Self-pay | Admitting: Sports Medicine

## 2018-06-01 DIAGNOSIS — R5383 Other fatigue: Secondary | ICD-10-CM | POA: Diagnosis not present

## 2018-06-01 DIAGNOSIS — M7121 Synovial cyst of popliteal space [Baker], right knee: Secondary | ICD-10-CM | POA: Diagnosis not present

## 2018-06-01 NOTE — Addendum Note (Signed)
Addended by: Donella Stade on: 06/01/2018 09:12 AM   Modules accepted: Orders

## 2018-06-01 NOTE — Progress Notes (Signed)
Subjective:    CC: Recheck knee  HPI: Jacob Cohen returns, we treated him for a Baker's cyst with aspiration and injection at the last visit, good resolution of the Baker's cyst.  He took a misstep, and developed severe pain in the medial head of the gastroc.  Symptoms are severe, persistent.  Localized without radiation.  He also has some anterior knee pain, x-rays did show knee osteoarthritis.  I reviewed the past medical history, family history, social history, surgical history, and allergies today and no changes were needed.  Please see the problem list section below in epic for further details.  Past Medical History: Past Medical History:  Diagnosis Date  . Heart disease    Past Surgical History: Past Surgical History:  Procedure Laterality Date  . CARDIAC ELECTROPHYSIOLOGY STUDY AND ABLATION  2012  . HERNIA REPAIR    . KNEE SURGERY Left 2000   Social History: Social History   Socioeconomic History  . Marital status: Divorced    Spouse name: Not on file  . Number of children: Not on file  . Years of education: Not on file  . Highest education level: Not on file  Occupational History  . Not on file  Social Needs  . Financial resource strain: Not on file  . Food insecurity:    Worry: Not on file    Inability: Not on file  . Transportation needs:    Medical: Not on file    Non-medical: Not on file  Tobacco Use  . Smoking status: Never Smoker  . Smokeless tobacco: Current User    Types: Chew  Substance and Sexual Activity  . Alcohol use: Yes    Comment: 2-4 beers on the weekend  . Drug use: No  . Sexual activity: Yes  Lifestyle  . Physical activity:    Days per week: Not on file    Minutes per session: Not on file  . Stress: Not on file  Relationships  . Social connections:    Talks on phone: Not on file    Gets together: Not on file    Attends religious service: Not on file    Active member of club or organization: Not on file    Attends meetings of clubs  or organizations: Not on file    Relationship status: Not on file  Other Topics Concern  . Not on file  Social History Narrative  . Not on file   Family History: Family History  Problem Relation Age of Onset  . Hypertension Mother   . Cancer Maternal Grandmother   . Alzheimer's disease Maternal Grandfather   . COPD Paternal Grandfather    Allergies: No Known Allergies Medications: See med rec.  Review of Systems: No fevers, chills, night sweats, weight loss, chest pain, or shortness of breath.   Objective:    General: Well Developed, well nourished, and in no acute distress.  Neuro: Alert and oriented x3, extra-ocular muscles intact, sensation grossly intact.  HEENT: Normocephalic, atraumatic, pupils equal round reactive to light, neck supple, no masses, no lymphadenopathy, thyroid nonpalpable.  Skin: Warm and dry, no rashes. Cardiac: Regular rate and rhythm, no murmurs rubs or gallops, no lower extremity edema.  Respiratory: Clear to auscultation bilaterally. Not using accessory muscles, speaking in full sentences. Right knee: Normal to inspection with no erythema or effusion or obvious bony abnormalities. Palpation normal with no warmth or joint line tenderness or patellar tenderness or condyle tenderness. Tenderness at the medial head of the gastrocnemius ROM normal in  flexion and extension and lower leg rotation. Ligaments with solid consistent endpoints including ACL, PCL, LCL, MCL. Negative Mcmurray's and provocative meniscal tests. Non painful patellar compression. Patellar and quadriceps tendons unremarkable. Hamstring and quadriceps strength is normal.  Procedure: Real-time Ultrasound Guided Injection of right knee Device: GE Logiq E  Verbal informed consent obtained.  Time-out conducted.  Noted no overlying erythema, induration, or other signs of local infection.  Skin prepped in a sterile fashion.  Local anesthesia: Topical Ethyl chloride.  With sterile  technique and under real time ultrasound guidance: 1 cc Kenalog 40, 2 cc lidocaine, 2 cc bupivacaine injected easily Completed without difficulty  Pain immediately resolved suggesting accurate placement of the medication.  Advised to call if fevers/chills, erythema, induration, drainage, or persistent bleeding.  Images permanently stored and available for review in the ultrasound unit.  Impression: Technically successful ultrasound guided injection.  The knee was strapped with a compressive dressing.  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 Today we are addressing the intra-articular derangement with a joint injection. He also has a medial head of the gastrocnemius strain, this was much better with tight compression. Return to see me in 1 month. ___________________________________________ Gwen Her. Dianah Field, M.D., ABFM., CAQSM. Primary Care and Sports Medicine [place] [facility]  Adjunct Professor of [employer] [facility] [employer]

## 2018-06-01 NOTE — Assessment & Plan Note (Signed)
Only minimal improvement with Baker's cyst aspiration. Today we are addressing the intra-articular derangement with a joint injection. He also has a medial head of the gastrocnemius strain, this was much better with tight compression. Return to see me in 1 month.

## 2018-06-03 LAB — TESTOSTERONE: Testosterone: 335 ng/dL (ref 250–827)

## 2018-06-03 LAB — VITAMIN D 1,25 DIHYDROXY
Vitamin D 1, 25 (OH)2 Total: 62 pg/mL (ref 18–72)
Vitamin D2 1, 25 (OH)2: <8 pg/mL
Vitamin D3 1, 25 (OH)2: 62 pg/mL

## 2018-06-04 NOTE — Telephone Encounter (Signed)
Call pt:  Vitamin D looks great.  Testosterone in normal range but on the low side of normal. You could certainly try a testosterone booster OTC to get you a little more in the middle range.

## 2018-06-05 ENCOUNTER — Telehealth: Payer: Self-pay | Admitting: Cardiology

## 2018-06-05 NOTE — Telephone Encounter (Signed)
Medical records requested from [facility] and [facility] (Dr. Ronnald Collum). 06/05/18 vlm

## 2018-06-15 ENCOUNTER — Encounter: Payer: Self-pay | Admitting: Physician Assistant

## 2018-06-15 DIAGNOSIS — R21 Rash and other nonspecific skin eruption: Secondary | ICD-10-CM

## 2018-06-26 MED ORDER — AMPHETAMINE-DEXTROAMPHET ER 30 MG PO CP24: 30.0000 mg | ORAL_CAPSULE | ORAL | 0 refills | Status: DC

## 2018-06-26 MED ORDER — AMPHETAMINE-DEXTROAMPHETAMINE 30 MG PO TABS: 30.0000 mg | ORAL_TABLET | Freq: Every day | ORAL | 0 refills | Status: DC

## 2018-06-26 NOTE — Addendum Note (Signed)
Addended by: Donella Stade on: 06/26/2018 07:24 AM   Modules accepted: Orders

## 2018-07-02 ENCOUNTER — Encounter: Payer: Self-pay | Admitting: Sports Medicine

## 2018-07-02 ENCOUNTER — Ambulatory Visit (INDEPENDENT_AMBULATORY_CARE_PROVIDER_SITE_OTHER): Payer: BLUE CROSS/BLUE SHIELD | Admitting: Sports Medicine

## 2018-07-02 ENCOUNTER — Ambulatory Visit (INDEPENDENT_AMBULATORY_CARE_PROVIDER_SITE_OTHER): Payer: BLUE CROSS/BLUE SHIELD

## 2018-07-02 DIAGNOSIS — M7121 Synovial cyst of popliteal space [Baker], right knee: Secondary | ICD-10-CM

## 2018-07-02 DIAGNOSIS — M25561 Pain in right knee: Secondary | ICD-10-CM | POA: Diagnosis not present

## 2018-07-02 DIAGNOSIS — M2550 Pain in unspecified joint: Secondary | ICD-10-CM | POA: Diagnosis not present

## 2018-07-02 DIAGNOSIS — M25512 Pain in left shoulder: Secondary | ICD-10-CM

## 2018-07-02 DIAGNOSIS — M79642 Pain in left hand: Secondary | ICD-10-CM

## 2018-07-02 DIAGNOSIS — G8929 Other chronic pain: Secondary | ICD-10-CM

## 2018-07-02 DIAGNOSIS — M79641 Pain in right hand: Secondary | ICD-10-CM | POA: Diagnosis not present

## 2018-07-02 MED ORDER — CELECOXIB 200 MG PO CAPS: ORAL_CAPSULE | ORAL | 2 refills | Status: DC

## 2018-07-02 NOTE — Assessment & Plan Note (Signed)
Suspect labral tear. Symptoms have been present for greater than 6 weeks in spite of conservative measures, adding an x-ray today, as well as MR arthrogram. I would like to get his shoulder arthrogram and knee noncontrast MRI done on the same day.

## 2018-07-02 NOTE — Progress Notes (Signed)
Subjective:    CC: Multiple issues  HPI: Right knee pain: We drained and injected a Baker's cyst, then drained and injected his right knee joint.  Only partial response is each time, persistent pain, medial joint line with locking and buckling.  In addition he has pain in all of his joints, worse in the mornings, mother has rheumatoid arthritis.  He does get significant polyarthralgias in his MCPs and PIPs.  Left shoulder pain: Localized anterior and posterior, worse with overhead activities, reaching back with internal rotation and reaching across his chest, moderate mechanical symptoms.  He did have a fall down the stairs several weeks back.  I reviewed the past medical history, family history, social history, surgical history, and allergies today and no changes were needed.  Please see the problem list section below in epic for further details.  Past Medical History: Past Medical History:  Diagnosis Date  . Heart disease    Past Surgical History: Past Surgical History:  Procedure Laterality Date  . CARDIAC ELECTROPHYSIOLOGY STUDY AND ABLATION  2012  . HERNIA REPAIR    . KNEE SURGERY Left 2000   Social History: Social History   Socioeconomic History  . Marital status: Divorced    Spouse name: Not on file  . Number of children: Not on file  . Years of education: Not on file  . Highest education level: Not on file  Occupational History  . Not on file  Social Needs  . Financial resource strain: Not on file  . Food insecurity:    Worry: Not on file    Inability: Not on file  . Transportation needs:    Medical: Not on file    Non-medical: Not on file  Tobacco Use  . Smoking status: Never Smoker  . Smokeless tobacco: Current User    Types: Chew  Substance and Sexual Activity  . Alcohol use: Yes    Comment: 2-4 beers on the weekend  . Drug use: No  . Sexual activity: Yes  Lifestyle  . Physical activity:    Days per week: Not on file    Minutes per session: Not on  file  . Stress: Not on file  Relationships  . Social connections:    Talks on phone: Not on file    Gets together: Not on file    Attends religious service: Not on file    Active member of club or organization: Not on file    Attends meetings of clubs or organizations: Not on file    Relationship status: Not on file  Other Topics Concern  . Not on file  Social History Narrative  . Not on file   Family History: Family History  Problem Relation Age of Onset  . Hypertension Mother   . Cancer Maternal Grandmother   . Alzheimer's disease Maternal Grandfather   . COPD Paternal Grandfather    Allergies: No Known Allergies Medications: See med rec.  Review of Systems: No fevers, chills, night sweats, weight loss, chest pain, or shortness of breath.   Objective:    General: Well Developed, well nourished, and in no acute distress.  Neuro: Alert and oriented x3, extra-ocular muscles intact, sensation grossly intact.  HEENT: Normocephalic, atraumatic, pupils equal round reactive to light, neck supple, no masses, no lymphadenopathy, thyroid nonpalpable.  Skin: Warm and dry, no rashes. Cardiac: Regular rate and rhythm, no murmurs rubs or gallops, no lower extremity edema.  Respiratory: Clear to auscultation bilaterally. Not using accessory muscles, speaking in full sentences.  Left shoulder: Inspection reveals no abnormalities, atrophy or asymmetry. Palpation is normal with no tenderness over AC joint or bicipital groove. ROM is full in all planes. Rotator cuff strength normal throughout. No signs of impingement with negative Neer and Hawkin's tests, empty can. Speeds and Yergason's tests normal. Positive Obrien's, positive crank, negative clunk, and good stability. Normal scapular function observed. No painful arc and no drop arm sign. No apprehension sign Right knee: Normal to inspection with no erythema or effusion or obvious bony abnormalities. Moderate tenderness the medial  joint line ROM normal in flexion and extension and lower leg rotation. Ligaments with solid consistent endpoints including ACL, PCL, LCL, MCL. Negative Mcmurray's and provocative meniscal tests. Non painful patellar compression. Patellar and quadriceps tendons unremarkable. Hamstring and quadriceps strength is normal.  Impression and Recommendations:    Polyarthralgia Family history of rheumatoid arthritis in his mother. Polyarthralgias including small joints.  Moderate gelling and worse with poor weather. Adding Celebrex, negative CCP, ANA testing, I am going to complete the testing with an RF, uric acid, CK.  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 ACL is somewhat loose, he does have pain at the medial joint line. At this point we do need an MRI for arthroscopy planning.  Left shoulder pain Suspect labral tear. Symptoms have been present for greater than 6 weeks in spite of conservative measures, adding an x-ray today, as well as MR arthrogram. I would like to get his shoulder arthrogram and knee noncontrast MRI done on the same day.  ___________________________________________ Gwen Her. Dianah Field, M.D., ABFM., CAQSM. Primary Care and Sports Medicine [place] [facility]  Adjunct Professor of [employer] [facility] [employer] Medicine

## 2018-07-02 NOTE — Assessment & Plan Note (Addendum)
Family history of rheumatoid arthritis in his mother. Polyarthralgias including small joints.  Moderate gelling and worse with poor weather. Adding Celebrex, negative CCP, ANA testing, I am going to complete the testing with an RF, uric acid, CK.

## 2018-07-02 NOTE — Assessment & Plan Note (Signed)
Persistent pain, discomfort, knee instability on the right in spite of a Baker's cyst aspiration and injection and an intra-articular aspiration and injection. ACL is somewhat loose, he does have pain at the medial joint line. At this point we do need an MRI for arthroscopy planning.

## 2018-07-08 LAB — RHEUMATOID ARTHRITIS DIAGNOSTIC PANEL, COMPREHENSIVE
Cyclic Citrullin Peptide Ab: <16 Units (ref <20)
Rheumatoid Factor (IgA): <5 U (ref <=6)
Rheumatoid Factor (IgG): 5 U (ref <=6)
Rheumatoid Factor (IgM): <5 U (ref <=6)
SSA (Ro) (ENA) Antibody, IgG: <1 AI
SSB (La) (ENA) Antibody, IgG: <1 AI

## 2018-07-08 LAB — ANA, IFA COMPREHENSIVE PANEL
Anti Nuclear Antibody(ANA): NEGATIVE
ENA SM Ab Ser-aCnc: <1 AI
SM/RNP: <1 AI
SSA (Ro) (ENA) Antibody, IgG: <1 AI
SSB (La) (ENA) Antibody, IgG: <1 AI
Scleroderma (Scl-70) (ENA) Antibody, IgG: <1 AI
ds DNA Ab: <1 IU/mL

## 2018-07-08 LAB — SEDIMENTATION RATE: Sed Rate: 6 mm/h (ref 0–15)

## 2018-07-08 LAB — CK: Total CK: 272 U/L — ABNORMAL HIGH (ref 44–196)

## 2018-07-08 LAB — URIC ACID: Uric Acid, Serum: 5.6 mg/dL (ref 4.0–8.0)

## 2018-07-10 ENCOUNTER — Telehealth: Payer: Self-pay | Admitting: Cardiology

## 2018-07-10 NOTE — Telephone Encounter (Signed)
Letter received from [employer]. Patient was not seen on service dates requested.

## 2018-07-12 ENCOUNTER — Ambulatory Visit: Payer: Self-pay | Admitting: Allergy and Immunology

## 2018-07-16 ENCOUNTER — Ambulatory Visit: Payer: BLUE CROSS/BLUE SHIELD | Admitting: Sports Medicine

## 2018-07-16 ENCOUNTER — Other Ambulatory Visit: Payer: BLUE CROSS/BLUE SHIELD

## 2018-07-16 ENCOUNTER — Ambulatory Visit (INDEPENDENT_AMBULATORY_CARE_PROVIDER_SITE_OTHER): Payer: BLUE CROSS/BLUE SHIELD | Admitting: Sports Medicine

## 2018-07-16 ENCOUNTER — Encounter: Payer: Self-pay | Admitting: Sports Medicine

## 2018-07-16 ENCOUNTER — Ambulatory Visit (INDEPENDENT_AMBULATORY_CARE_PROVIDER_SITE_OTHER): Payer: BLUE CROSS/BLUE SHIELD

## 2018-07-16 DIAGNOSIS — G8929 Other chronic pain: Secondary | ICD-10-CM

## 2018-07-16 DIAGNOSIS — S43432A Superior glenoid labrum lesion of left shoulder, initial encounter: Secondary | ICD-10-CM | POA: Diagnosis not present

## 2018-07-16 DIAGNOSIS — S46212A Strain of muscle, fascia and tendon of other parts of biceps, left arm, initial encounter: Secondary | ICD-10-CM | POA: Diagnosis not present

## 2018-07-16 DIAGNOSIS — M25512 Pain in left shoulder: Principal | ICD-10-CM

## 2018-07-16 DIAGNOSIS — M25561 Pain in right knee: Secondary | ICD-10-CM | POA: Diagnosis not present

## 2018-07-16 DIAGNOSIS — S83241A Other tear of medial meniscus, current injury, right knee, initial encounter: Secondary | ICD-10-CM

## 2018-07-16 DIAGNOSIS — M7121 Synovial cyst of popliteal space [Baker], right knee: Secondary | ICD-10-CM | POA: Diagnosis not present

## 2018-07-16 DIAGNOSIS — X58XXXA Exposure to other specified factors, initial encounter: Secondary | ICD-10-CM

## 2018-07-16 DIAGNOSIS — M1711 Unilateral primary osteoarthritis, right knee: Secondary | ICD-10-CM | POA: Diagnosis not present

## 2018-07-16 NOTE — Assessment & Plan Note (Signed)
Suspected labral tear. Arthrogram injection today. Further management per MRI results.

## 2018-07-16 NOTE — Progress Notes (Signed)
   Procedure: Real-time Ultrasound Guided gadolinium contrast injection of left glenohumeral joint Device: GE Logiq E  Verbal informed consent obtained.  Time-out conducted.  Noted no overlying erythema, induration, or other signs of local infection.  Skin prepped in a sterile fashion.  Local anesthesia: Topical Ethyl chloride.  With sterile technique and under real time ultrasound guidance: 22-gauge spinal needle advanced into the glenohumeral joint from a posterior approach taking care to avoid the labrum, I then injected 1 cc Kenalog 40, 2 cc lidocaine, 2 cc bupivacaine, syringe switched and 0.1 cc gadolinium injected, syringe again switched and 10 cc sterile saline used to flush the needle and distend the joint. Joint visualized and capsule seen distending confirming intra-articular placement of contrast material and medication. Completed without difficulty  Advised to call if fevers/chills, erythema, induration, drainage, or persistent bleeding.  Images permanently stored and available for review in the ultrasound unit.  Impression: Technically successful ultrasound guided gadolinium contrast injection for MR arthrography.  Please see separate MR arthrogram report.

## 2018-07-19 ENCOUNTER — Encounter: Payer: Self-pay | Admitting: Physician Assistant

## 2018-07-19 DIAGNOSIS — R239 Unspecified skin changes: Secondary | ICD-10-CM

## 2018-07-24 ENCOUNTER — Telehealth: Payer: Self-pay | Admitting: *Deleted

## 2018-07-24 DIAGNOSIS — M25561 Pain in right knee: Secondary | ICD-10-CM | POA: Diagnosis not present

## 2018-07-24 DIAGNOSIS — M25512 Pain in left shoulder: Secondary | ICD-10-CM | POA: Diagnosis not present

## 2018-07-24 NOTE — Telephone Encounter (Addendum)
    [facility] Pre-operative Risk Assessment    Request for surgical clearance:  1. What type of surgery is being performed? RIGHT KNEE SCOPE   2. When is this surgery scheduled? TBD PATIENT BROUGHT CLEARANCE REQUEST TO OFFICE AND WANTS SURGERY TO BE DONE 08/02/18  3. What type of clearance is required (medical clearance vs. Pharmacy clearance to hold med vs. Both)? CLEARANCE   4. Are there any medications that need to be held prior to surgery and how long?   5. Practice name and name of physician performing surgery? [facility]    6. What is your office phone number? [phone]    7.   What is your office fax number? [place]   Anesthesia type (None, local, MAC, general) ? UNKNOWN

## 2018-07-25 NOTE — Telephone Encounter (Signed)
   Primary Cardiologist: Will Meredith Leeds, MD  Chart reviewed as part of pre-operative protocol coverage. Patient was contacted 07/25/2018 in reference to pre-operative risk assessment for pending surgery as outlined below.  Jacob Cohen was last seen on 05/21/18  by Dr. Curt Bears.  Since that day, Jacob Cohen has done well. No further palpitations on metoprolol. He goes to gym every day and work out for 60-90 minutes.   Therefore, based on ACC/AHA guidelines, the patient would be at acceptable risk for the planned procedure without further cardiovascular testing.   I will route this recommendation to the requesting party via Epic fax function and remove from pre-op pool.  Please call with questions.  [place], [place] 07/25/2018, 9:26 AM

## 2018-07-26 ENCOUNTER — Encounter (HOSPITAL_BASED_OUTPATIENT_CLINIC_OR_DEPARTMENT_OTHER): Payer: Self-pay | Admitting: *Deleted

## 2018-07-26 ENCOUNTER — Other Ambulatory Visit: Payer: Self-pay

## 2018-07-26 NOTE — Progress Notes (Addendum)
Pt's history, recent cardiology visits, medications, procedures,and clearance reviewed with Dr Roanna Banning. No further testing needed for surgery 08/02/18 at [facility].

## 2018-07-30 ENCOUNTER — Other Ambulatory Visit: Payer: Self-pay | Admitting: Physician Assistant

## 2018-07-30 MED ORDER — AMPHETAMINE-DEXTROAMPHETAMINE 30 MG PO TABS: 30.0000 mg | ORAL_TABLET | Freq: Every day | ORAL | 0 refills | Status: DC

## 2018-07-30 MED ORDER — AMPHETAMINE-DEXTROAMPHET ER 30 MG PO CP24: 30.0000 mg | ORAL_CAPSULE | ORAL | 0 refills | Status: DC

## 2018-08-08 NOTE — Anesthesia Preprocedure Evaluation (Addendum)
Anesthesia Evaluation  Patient identified by MRN, date of birth, ID band Patient awake    Reviewed: Allergy & Precautions, NPO status , Patient's Chart, lab work & pertinent test results  Airway Mallampati: II  TM Distance: >3 FB Neck ROM: Full    Dental no notable dental hx. (+) Teeth Intact   Pulmonary neg pulmonary ROS,    Pulmonary exam normal breath sounds clear to auscultation       Cardiovascular Exercise Tolerance: Good Normal cardiovascular exam Rhythm:Regular Rate:Normal  05/21/18 EKG R79   Neuro/Psych negative neurological ROS     GI/Hepatic negative GI ROS, Neg liver ROS,   Endo/Other  negative endocrine ROS  Renal/GU negative Renal ROS     Musculoskeletal   Abdominal   Peds  Hematology   Anesthesia Other Findings   Reproductive/Obstetrics                            Anesthesia Physical Anesthesia Plan  ASA: I  Anesthesia Plan: General   Post-op Pain Management:    Induction: Intravenous  PONV Risk Score and Plan: 2 and Treatment may vary due to age or medical condition, Ondansetron and Dexamethasone  Airway Management Planned: LMA  Additional Equipment:   Intra-op Plan:   Post-operative Plan:   Informed Consent:     Dental advisory given  Plan Discussed with:   Anesthesia Plan Comments: (GA w LMA)        Anesthesia Quick Evaluation

## 2018-08-08 NOTE — Telephone Encounter (Signed)
Spoke to pt informing him that we are unable to obtain records from [facility].  Procedure was greater than 10 yrs ago and they no longer have records. Pt understands I will update Dr. Curt Bears and discuss next step in treatment plan.  Pt made aware I would f/u in next several weeks w/ recommendation. Pt agreeable to plan.

## 2018-08-09 ENCOUNTER — Ambulatory Visit (HOSPITAL_BASED_OUTPATIENT_CLINIC_OR_DEPARTMENT_OTHER): Payer: BLUE CROSS/BLUE SHIELD | Admitting: Anesthesiology

## 2018-08-09 ENCOUNTER — Ambulatory Visit (HOSPITAL_BASED_OUTPATIENT_CLINIC_OR_DEPARTMENT_OTHER)
Admission: RE | Admit: 2018-08-09 | Discharge: 2018-08-09 | Disposition: A | Discharge status: Home / Self Care | Payer: BLUE CROSS/BLUE SHIELD | Attending: Orthopaedic Surgery | Admitting: Orthopaedic Surgery

## 2018-08-09 ENCOUNTER — Other Ambulatory Visit: Payer: Self-pay

## 2018-08-09 ENCOUNTER — Encounter (HOSPITAL_BASED_OUTPATIENT_CLINIC_OR_DEPARTMENT_OTHER): Payer: Self-pay

## 2018-08-09 ENCOUNTER — Encounter (HOSPITAL_BASED_OUTPATIENT_CLINIC_OR_DEPARTMENT_OTHER)
Admission: RE | Disposition: A | Discharge status: Home / Self Care | Payer: Self-pay | Source: Home / Self Care | Attending: Orthopaedic Surgery

## 2018-08-09 DIAGNOSIS — S83231A Complex tear of medial meniscus, current injury, right knee, initial encounter: Secondary | ICD-10-CM | POA: Diagnosis not present

## 2018-08-09 DIAGNOSIS — Z79899 Other long term (current) drug therapy: Secondary | ICD-10-CM | POA: Insufficient documentation

## 2018-08-09 DIAGNOSIS — S83241A Other tear of medial meniscus, current injury, right knee, initial encounter: Secondary | ICD-10-CM | POA: Diagnosis not present

## 2018-08-09 DIAGNOSIS — F909 Attention-deficit hyperactivity disorder, unspecified type: Secondary | ICD-10-CM | POA: Insufficient documentation

## 2018-08-09 DIAGNOSIS — X58XXXA Exposure to other specified factors, initial encounter: Secondary | ICD-10-CM | POA: Insufficient documentation

## 2018-08-09 HISTORY — DX: Syncope and collapse: R55

## 2018-08-09 HISTORY — DX: Supraventricular tachycardia: I47.1

## 2018-08-09 HISTORY — DX: Attention-deficit hyperactivity disorder, unspecified type: F90.9

## 2018-08-09 HISTORY — PX: KNEE ARTHROSCOPY WITH MEDIAL MENISECTOMY: SHX5651

## 2018-08-09 HISTORY — DX: Supraventricular tachycardia, unspecified: I47.10

## 2018-08-09 SURGERY — ARTHROSCOPY, KNEE, WITH MEDIAL MENISCECTOMY
Anesthesia: General | Site: Knee | Laterality: Right

## 2018-08-09 MED ORDER — EPINEPHRINE 30 MG/30ML IJ SOLN
INTRAMUSCULAR | Status: AC
  Filled 2018-08-09: qty 1

## 2018-08-09 MED ORDER — LACTATED RINGERS IV SOLN
INTRAVENOUS | Status: DC
  Administered 2018-08-09 (×3): via INTRAVENOUS

## 2018-08-09 MED ORDER — SCOPOLAMINE 1 MG/3DAYS TD PT72: 1.0000 | MEDICATED_PATCH | Freq: Once | TRANSDERMAL | Status: DC | PRN

## 2018-08-09 MED ORDER — CEFAZOLIN SODIUM-DEXTROSE 2-4 GM/100ML-% IV SOLN
2.0000 g | INTRAVENOUS | Status: AC
  Administered 2018-08-09: 2 g via INTRAVENOUS

## 2018-08-09 MED ORDER — KETOROLAC TROMETHAMINE 30 MG/ML IJ SOLN: 30.0000 mg | Freq: Once | INTRAMUSCULAR | Status: DC | PRN

## 2018-08-09 MED ORDER — MIDAZOLAM HCL 2 MG/2ML IJ SOLN
INTRAMUSCULAR | Status: AC
  Filled 2018-08-09: qty 2

## 2018-08-09 MED ORDER — DEXAMETHASONE SODIUM PHOSPHATE 10 MG/ML IJ SOLN
INTRAMUSCULAR | Status: AC
  Filled 2018-08-09: qty 1

## 2018-08-09 MED ORDER — PROPOFOL 10 MG/ML IV BOLUS
INTRAVENOUS | Status: DC | PRN
  Administered 2018-08-09: 200 mg via INTRAVENOUS

## 2018-08-09 MED ORDER — OXYCODONE HCL 5 MG PO TABS: ORAL_TABLET | ORAL | 0 refills | Status: AC

## 2018-08-09 MED ORDER — GABAPENTIN 100 MG PO CAPS
200.0000 mg | ORAL_CAPSULE | Freq: Once | ORAL | Status: AC
  Administered 2018-08-09: 200 mg via ORAL

## 2018-08-09 MED ORDER — ACETAMINOPHEN 500 MG PO TABS: 1000.0000 mg | ORAL_TABLET | Freq: Three times a day (TID) | ORAL | 0 refills | Status: AC

## 2018-08-09 MED ORDER — LIDOCAINE HCL (PF) 1 % IJ SOLN
INTRAMUSCULAR | Status: AC
  Filled 2018-08-09: qty 60

## 2018-08-09 MED ORDER — ONDANSETRON HCL 4 MG/2ML IJ SOLN: 4.0000 mg | Freq: Once | INTRAMUSCULAR | Status: DC | PRN

## 2018-08-09 MED ORDER — HYDROMORPHONE HCL 1 MG/ML IJ SOLN: 0.2500 mg | INTRAMUSCULAR | Status: DC | PRN

## 2018-08-09 MED ORDER — CHLORHEXIDINE GLUCONATE 4 % EX LIQD: 60.0000 mL | Freq: Once | CUTANEOUS | Status: DC

## 2018-08-09 MED ORDER — FENTANYL CITRATE (PF) 100 MCG/2ML IJ SOLN
INTRAMUSCULAR | Status: AC
  Filled 2018-08-09: qty 2

## 2018-08-09 MED ORDER — LIDOCAINE-EPINEPHRINE 1 %-1:100000 IJ SOLN
INTRAMUSCULAR | Status: AC
  Filled 2018-08-09: qty 2

## 2018-08-09 MED ORDER — CEFAZOLIN SODIUM-DEXTROSE 2-4 GM/100ML-% IV SOLN
INTRAVENOUS | Status: AC
  Filled 2018-08-09: qty 100

## 2018-08-09 MED ORDER — LIDOCAINE 2% (20 MG/ML) 5 ML SYRINGE
INTRAMUSCULAR | Status: AC
  Filled 2018-08-09: qty 5

## 2018-08-09 MED ORDER — EPHEDRINE 5 MG/ML INJ
INTRAVENOUS | Status: AC
  Filled 2018-08-09: qty 10

## 2018-08-09 MED ORDER — MIDAZOLAM HCL 2 MG/2ML IJ SOLN
1.0000 mg | INTRAMUSCULAR | Status: DC | PRN
  Administered 2018-08-09: 2 mg via INTRAVENOUS

## 2018-08-09 MED ORDER — ONDANSETRON HCL 4 MG PO TABS: 4.0000 mg | ORAL_TABLET | Freq: Three times a day (TID) | ORAL | 1 refills | Status: AC | PRN

## 2018-08-09 MED ORDER — ONDANSETRON HCL 4 MG/2ML IJ SOLN
INTRAMUSCULAR | Status: DC | PRN
  Administered 2018-08-09: 4 mg via INTRAVENOUS

## 2018-08-09 MED ORDER — FENTANYL CITRATE (PF) 100 MCG/2ML IJ SOLN
50.0000 ug | INTRAMUSCULAR | Status: DC | PRN
  Administered 2018-08-09: 100 ug via INTRAVENOUS

## 2018-08-09 MED ORDER — PHENYLEPHRINE 40 MCG/ML (10ML) SYRINGE FOR IV PUSH (FOR BLOOD PRESSURE SUPPORT)
PREFILLED_SYRINGE | INTRAVENOUS | Status: AC
  Filled 2018-08-09: qty 10

## 2018-08-09 MED ORDER — ACETAMINOPHEN 500 MG PO TABS
ORAL_TABLET | ORAL | Status: AC
  Filled 2018-08-09: qty 2

## 2018-08-09 MED ORDER — HYDROCODONE-ACETAMINOPHEN 7.5-325 MG PO TABS: 1.0000 | ORAL_TABLET | Freq: Once | ORAL | Status: DC | PRN

## 2018-08-09 MED ORDER — LIDOCAINE HCL (CARDIAC) PF 100 MG/5ML IV SOSY
PREFILLED_SYRINGE | INTRAVENOUS | Status: DC | PRN
  Administered 2018-08-09: 100 mg via INTRAVENOUS

## 2018-08-09 MED ORDER — PROPOFOL 500 MG/50ML IV EMUL
INTRAVENOUS | Status: AC
  Filled 2018-08-09: qty 50

## 2018-08-09 MED ORDER — ACETAMINOPHEN 500 MG PO TABS
1000.0000 mg | ORAL_TABLET | Freq: Once | ORAL | Status: AC
  Administered 2018-08-09: 1000 mg via ORAL

## 2018-08-09 MED ORDER — SUCCINYLCHOLINE CHLORIDE 200 MG/10ML IV SOSY
PREFILLED_SYRINGE | INTRAVENOUS | Status: AC
  Filled 2018-08-09: qty 10

## 2018-08-09 MED ORDER — BUPIVACAINE HCL (PF) 0.25 % IJ SOLN
INTRAMUSCULAR | Status: AC
  Filled 2018-08-09: qty 90

## 2018-08-09 MED ORDER — BUPIVACAINE-EPINEPHRINE (PF) 0.25% -1:200000 IJ SOLN
INTRAMUSCULAR | Status: AC
  Filled 2018-08-09: qty 30

## 2018-08-09 MED ORDER — ASPIRIN 81 MG PO TABS: 81.0000 mg | ORAL_TABLET | Freq: Every day | ORAL | 0 refills | Status: AC

## 2018-08-09 MED ORDER — MEPERIDINE HCL 25 MG/ML IJ SOLN: 6.2500 mg | INTRAMUSCULAR | Status: DC | PRN

## 2018-08-09 MED ORDER — GABAPENTIN 100 MG PO CAPS
ORAL_CAPSULE | ORAL | Status: AC
  Filled 2018-08-09: qty 2

## 2018-08-09 MED ORDER — SODIUM CHLORIDE 0.9 % IR SOLN
Status: DC | PRN
  Administered 2018-08-09: 1500 mL

## 2018-08-09 MED ORDER — ONDANSETRON HCL 4 MG/2ML IJ SOLN
INTRAMUSCULAR | Status: AC
  Filled 2018-08-09: qty 2

## 2018-08-09 MED ORDER — DEXAMETHASONE SODIUM PHOSPHATE 10 MG/ML IJ SOLN
INTRAMUSCULAR | Status: DC | PRN
  Administered 2018-08-09: 10 mg via INTRAVENOUS

## 2018-08-09 MED ORDER — BUPIVACAINE HCL (PF) 0.25 % IJ SOLN
INTRAMUSCULAR | Status: DC | PRN
  Administered 2018-08-09: 20 mL

## 2018-08-09 SURGICAL SUPPLY — 42 items
BANDAGE ACE 6X5 VEL STRL LF (GAUZE/BANDAGES/DRESSINGS) ×2 IMPLANT
BANDAGE ESMARK 6X9 LF (GAUZE/BANDAGES/DRESSINGS) IMPLANT
BENZOIN TINCTURE PRP APPL 2/3 (GAUZE/BANDAGES/DRESSINGS) IMPLANT
BLADE CLIPPER SURG (BLADE) IMPLANT
BNDG ESMARK 6X9 LF (GAUZE/BANDAGES/DRESSINGS)
CHLORAPREP W/TINT 26ML (MISCELLANEOUS) ×2 IMPLANT
CLSR STERI-STRIP ANTIMIC 1/2X4 (GAUZE/BANDAGES/DRESSINGS) ×2 IMPLANT
CUFF TOURNIQUET SINGLE 34IN LL (TOURNIQUET CUFF) ×2 IMPLANT
DISSECTOR 3.5MM X 13CM CVD (MISCELLANEOUS) ×2 IMPLANT
DISSECTOR 4.0MMX13CM CVD (MISCELLANEOUS) IMPLANT
DRAPE ARTHROSCOPY W/POUCH 90 (DRAPES) ×2 IMPLANT
DRAPE IMP U-DRAPE 54X76 (DRAPES) ×2 IMPLANT
DRAPE U-SHAPE 47X51 STRL (DRAPES) ×2 IMPLANT
GAUZE SPONGE 4X4 12PLY STRL (GAUZE/BANDAGES/DRESSINGS) ×2 IMPLANT
GLOVE BIO SURGEON STRL SZ 6.5 (GLOVE) ×2 IMPLANT
GLOVE BIO SURGEON STRL SZ7 (GLOVE) ×2 IMPLANT
GLOVE BIOGEL PI IND STRL 7.0 (GLOVE) ×1 IMPLANT
GLOVE BIOGEL PI IND STRL 7.5 (GLOVE) ×1 IMPLANT
GLOVE BIOGEL PI IND STRL 8 (GLOVE) ×1 IMPLANT
GLOVE BIOGEL PI INDICATOR 7.0 (GLOVE) ×1
GLOVE BIOGEL PI INDICATOR 7.5 (GLOVE) ×1
GLOVE BIOGEL PI INDICATOR 8 (GLOVE) ×1
GLOVE ECLIPSE 8.0 STRL XLNG CF (GLOVE) ×4 IMPLANT
GLOVE EXAM NITRILE MD LF STRL (GLOVE) ×2 IMPLANT
GOWN STRL REUS W/ TWL LRG LVL3 (GOWN DISPOSABLE) IMPLANT
GOWN STRL REUS W/ TWL XL LVL3 (GOWN DISPOSABLE) ×2 IMPLANT
GOWN STRL REUS W/TWL LRG LVL3 (GOWN DISPOSABLE)
GOWN STRL REUS W/TWL XL LVL3 (GOWN DISPOSABLE) ×4 IMPLANT
KIT TURNOVER KIT B (KITS) ×2 IMPLANT
MANIFOLD NEPTUNE II (INSTRUMENTS) ×2 IMPLANT
NDL SAFETY ECLIPSE 18X1.5 (NEEDLE) ×1 IMPLANT
NEEDLE HYPO 18GX1.5 SHARP (NEEDLE) ×1
NS IRRIG 1000ML POUR BTL (IV SOLUTION) IMPLANT
PACK ARTHROSCOPY DSU (CUSTOM PROCEDURE TRAY) ×2 IMPLANT
PORT APPOLLO RF 90DEGREE MULTI (SURGICAL WAND) IMPLANT
SLEEVE SCD COMPRESS KNEE MED (MISCELLANEOUS) ×2 IMPLANT
SUT MNCRL AB 4-0 PS2 18 (SUTURE) ×2 IMPLANT
SYR 5ML LUER SLIP (SYRINGE) ×2 IMPLANT
TOWEL GREEN STERILE FF (TOWEL DISPOSABLE) ×2 IMPLANT
TUBE CONNECTING 20X1/4 (TUBING) ×2 IMPLANT
TUBING ARTHROSCOPY IRRIG 16FT (MISCELLANEOUS) ×2 IMPLANT
WATER STERILE IRR 1000ML POUR (IV SOLUTION) ×2 IMPLANT

## 2018-08-09 NOTE — Anesthesia Procedure Notes (Signed)
Procedure Name: LMA Insertion Date/Time: 08/09/2018 7:35 AM Performed by: Willa Frater, CRNA Pre-anesthesia Checklist: Patient identified, Emergency Drugs available, Suction available and Patient being monitored Patient Re-evaluated:Patient Re-evaluated prior to induction Oxygen Delivery Method: Circle system utilized Preoxygenation: Pre-oxygenation with 100% oxygen Induction Type: IV induction Ventilation: Mask ventilation without difficulty LMA: LMA inserted LMA Size: 5.0 Number of attempts: 1 Airway Equipment and Method: Bite block Placement Confirmation: positive ETCO2 Tube secured with: Tape Dental Injury: Teeth and Oropharynx as per pre-operative assessment

## 2018-08-09 NOTE — H&P (Signed)
PREOPERATIVE H&P  Chief Complaint: TEAR OF MENISCUS RIGHT KNEE  HPI: Jacob Cohen is a 48 y.o. male who presents for preoperative history and physical with a diagnosis of TEAR OF MENISCUS RIGHT KNEE. Symptoms are rated as moderate to severe, and have been worsening.  This is significantly impairing activities of daily living.  Please see my clinic note for full details on this patient's care.  He has elected for surgical management.   Past Medical History:  Diagnosis Date  . ADHD   . Paroxysmal SVT (supraventricular tachycardia) (HCC)    s/p ablation  . Syncope    Past Surgical History:  Procedure Laterality Date  . CARDIAC ELECTROPHYSIOLOGY STUDY AND ABLATION  2012  . HERNIA REPAIR    . KNEE SURGERY Left 2000   Social History   Socioeconomic History  . Marital status: Divorced    Spouse name: Not on file  . Number of children: Not on file  . Years of education: Not on file  . Highest education level: Not on file  Occupational History  . Not on file  Social Needs  . Financial resource strain: Not on file  . Food insecurity:    Worry: Not on file    Inability: Not on file  . Transportation needs:    Medical: Not on file    Non-medical: Not on file  Tobacco Use  . Smoking status: Never Smoker  . Smokeless tobacco: Former Network engineer and Sexual Activity  . Alcohol use: Yes    Comment: 2-4 beers on the weekend  . Drug use: No  . Sexual activity: Yes  Lifestyle  . Physical activity:    Days per week: Not on file    Minutes per session: Not on file  . Stress: Not on file  Relationships  . Social connections:    Talks on phone: Not on file    Gets together: Not on file    Attends religious service: Not on file    Active member of club or organization: Not on file    Attends meetings of clubs or organizations: Not on file    Relationship status: Not on file  Other Topics Concern  . Not on file  Social History Narrative  . Not on file   Family History   Problem Relation Age of Onset  . Hypertension Mother   . Cancer Maternal Grandmother   . Alzheimer's disease Maternal Grandfather   . COPD Paternal Grandfather    No Known Allergies Prior to Admission medications   Medication Sig Start Date End Date Taking? Authorizing Provider  amphetamine-dextroamphetamine (ADDERALL XR) 30 MG 24 hr capsule Take 1 capsule (30 mg total) by mouth every morning. 07/30/18  Yes Breeback, Jade L, PA-C  amphetamine-dextroamphetamine (ADDERALL) 30 MG tablet Take 1 tablet by mouth daily. At 2pm. 07/30/18  Yes Breeback, Jade L, PA-C  Ascorbic Acid (VITAMIN C PO) Take by mouth.   Yes [provider]  celecoxib (CELEBREX) 200 MG capsule One to 2 tablets by mouth daily as needed for pain. 07/02/18  Yes Silverio Decamp, MD  metoprolol succinate (TOPROL-XL) 25 MG 24 hr tablet TAKE 1 TABLET BY MOUTH EVERY DAY 04/23/18  Yes Breeback, Jade L, PA-C     Positive ROS: All other systems have been reviewed and were otherwise negative with the exception of those mentioned in the HPI and as above.  Physical Exam: General: Alert, no acute distress Cardiovascular: No pedal edema Respiratory: No cyanosis, no use of accessory  musculature GI: No organomegaly, abdomen is soft and non-tender Skin: No lesions in the area of chief complaint Neurologic: Sensation intact distally Psychiatric: Patient is competent for consent with normal mood and affect Lymphatic: No axillary or cervical lymphadenopathy  MUSCULOSKELETAL: R knee: pain with flexion, wwp   Assessment: TEAR OF MENISCUS RIGHT KNEE  Plan: Plan for Procedure(s): RIGHT KNEE ARTHROSCOPY WITH MEDIAL MENISECTOMY  The risks benefits and alternatives were discussed with the patient including but not limited to the risks of nonoperative treatment, versus surgical intervention including infection, bleeding, nerve injury,  blood clots, cardiopulmonary complications, morbidity, mortality, among others, and they were  willing to proceed.   Hiram Gash, MD  08/09/2018 7:18 AM

## 2018-08-09 NOTE — Op Note (Signed)
Orthopaedic Surgery Operative Note (CSN: [id number])  Jacob Cohen  Sep 15, 1970 Date of Surgery: 08/09/2018   Diagnoses:  Medial meniscus tear and mild tricompartmental changes in the right knee  Procedure: Right partial medial meniscectomy 29881   Operative Finding Successful completion of planned procedure.  40% total medial meniscal volume resected.  Chondroplasty performed of the medial femoral condyle softening.  Exam under anesthesia: Range of motion full and symmetric to opposite knee, ligamentously stable exam with normal lachman  Suprapatellar pouch: Normal  Medial compartment: Grade 1 changes on the medial femoral condyle and grade 2 changes on the tibial plateau, there is an area about 2 x 3 cm of softening on the medial femoral condyle but this was not able to be addressed as aggressive chondroplasty would cause significant cartilage loss.  We performed a gentle chondroplasty at the edges but felt that this was stable.  Lateral Compartment: Normal  Intercondylar Notch: Normal  Post-operative plan: The patient will be bearing as tolerated.  The patient will be discharged home.  DVT prophylaxis aspirin 81 mg a day.  Pain control with PRN pain medication preferring oral medicines.  Follow up plan will be scheduled in approximately 7 days for incision check.  Post-Op Diagnosis: Same Surgeons:Primary: Hiram Gash, MD Assistants: Location: [facility] OR ROOM 2 Anesthesia: General Antibiotics: Ancef 2g preop Tourniquet time: None Estimated Blood Loss:  Complications: None Specimens: None Implants: * No implants in log *  Indications for Surgery:   Jacob Cohen is a 48 y.o. male with continued mechanical symptoms and pain in the medial side of his knee.  He failed nonoperative measures including injection and activity modification.  Benefits and risks of operative and nonoperative management were discussed prior to surgery with patient/guardian(s) and informed consent form was  completed.  Specific risks including infection, need for additional surgery, continued pain from arthritis, post meniscectomy syndrome, stiffness.   Procedure:   The patient was identified properly. Informed consent was obtained and the surgical site was marked. The patient was taken up to suite where general anesthesia was induced. The patient was placed in the supine position with a post against the surgical leg and a nonsterile tourniquet applied. The surgical leg was then prepped and draped usual sterile fashion.  A standard surgical timeout was performed.  2 standard anterior portals were made and diagnostic arthroscopy performed. Please note the findings as noted above.  We performed a partial medial meniscectomy using a arthroscopic basket as well as a shaver back to a stable base.  It was a complex tear and we had to remove about 40% of the total medial meniscal volume posteriorly.  Anteriorly he was able to keep most of his meniscus.  Patient did have softening of his medial femoral condyle over a large area and some medial fraying.  This was not unstable and a gentle chondroplasty performed at the edges but this was felt to be causing some of his pain but not likely actionable.  Incisions closed with absorbable suture. The patient was awoken from general anesthesia and taken to the PACU in stable condition without complication.

## 2018-08-09 NOTE — Anesthesia Postprocedure Evaluation (Signed)
Anesthesia Post Note  Patient: Jacob Cohen  Procedure(s) Performed: RIGHT KNEE ARTHROSCOPY WITH MEDIAL MENISECTOMY (Right Knee)     Patient location during evaluation: PACU Anesthesia Type: General Level of consciousness: awake and alert Pain management: pain level controlled Vital Signs Assessment: post-procedure vital signs reviewed and stable Respiratory status: spontaneous breathing, nonlabored ventilation, respiratory function stable and patient connected to nasal cannula oxygen Cardiovascular status: blood pressure returned to baseline and stable Postop Assessment: no apparent nausea or vomiting Anesthetic complications: no    Last Vitals:  Vitals:   08/09/18 0900 08/09/18 0915  BP: 109/75 109/78  Pulse: 71 74  Resp: 12 11  Temp:    SpO2: 99% 98%    Last Pain:  Vitals:   08/09/18 0900  TempSrc:   PainSc: 3                  [place]et [place]

## 2018-08-09 NOTE — Transfer of Care (Signed)
Immediate Anesthesia Transfer of Care Note  Patient: Jacob Cohen  Procedure(s) Performed: RIGHT KNEE ARTHROSCOPY WITH MEDIAL MENISECTOMY (Right Knee)  Patient Location: PACU  Anesthesia Type:General  Level of Consciousness: awake, alert  and oriented  Airway & Oxygen Therapy: Patient Spontanous Breathing and Patient connected to face mask oxygen  Post-op Assessment: Report given to RN and Post -op Vital signs reviewed and stable  Post vital signs: Reviewed and stable  Last Vitals:  Vitals Value Taken Time  BP    Temp    Pulse 89 08/09/2018  8:26 AM  Resp 17 08/09/2018  8:26 AM  SpO2 100 % 08/09/2018  8:26 AM  Vitals shown include unvalidated device data.  Last Pain:  Vitals:   08/09/18 0645  TempSrc: Oral  PainSc: 0-No pain         Complications: No apparent anesthesia complications

## 2018-08-09 NOTE — Discharge Instructions (Signed)
° °  NO TYLENOL BEFORE 1:30 PM Today!     Post Anesthesia Home Care Instructions  Activity: Get plenty of rest for the remainder of the day. A responsible individual must stay with you for 24 hours following the procedure.  For the next 24 hours, DO NOT: -Drive a car -Paediatric nurse -Drink alcoholic beverages -Take any medication unless instructed by your physician -Make any legal decisions or sign important papers.  Meals: Start with liquid foods such as gelatin or soup. Progress to regular foods as tolerated. Avoid greasy, spicy, heavy foods. If nausea and/or vomiting occur, drink only clear liquids until the nausea and/or vomiting subsides. Call your physician if vomiting continues.  Special Instructions/Symptoms: Your throat may feel dry or sore from the anesthesia or the breathing tube placed in your throat during surgery. If this causes discomfort, gargle with warm salt water. The discomfort should disappear within 24 hours.  If you had a scopolamine patch placed behind your ear for the management of post- operative nausea and/or vomiting:  1. The medication in the patch is effective for 72 hours, after which it should be removed.  Wrap patch in a tissue and discard in the trash. Wash hands thoroughly with soap and water. 2. You may remove the patch earlier than 72 hours if you experience unpleasant side effects which may include dry mouth, dizziness or visual disturbances. 3. Avoid touching the patch. Wash your hands with soap and water after contact with the patch.

## 2018-08-10 ENCOUNTER — Encounter (HOSPITAL_BASED_OUTPATIENT_CLINIC_OR_DEPARTMENT_OTHER): Payer: Self-pay | Admitting: Orthopaedic Surgery

## 2018-08-13 ENCOUNTER — Telehealth: Payer: Self-pay

## 2018-08-13 NOTE — Telephone Encounter (Signed)
Does he have flu exposure with his partner? What is the timeline? Any travel?

## 2018-08-13 NOTE — Telephone Encounter (Signed)
His symptoms started on Friday after travel to [place]. He is now feeling better. His fever has resolved.

## 2018-08-13 NOTE — Telephone Encounter (Signed)
Jacob Cohen's partner called the [facility] about flu like symptoms. She reported he has cough, fever, body aches, congestion and chills. I called and left a message on his phone.

## 2018-08-21 DIAGNOSIS — S83241D Other tear of medial meniscus, current injury, right knee, subsequent encounter: Secondary | ICD-10-CM | POA: Diagnosis not present

## 2018-08-24 ENCOUNTER — Telehealth: Payer: Self-pay | Admitting: Cardiology

## 2018-08-24 NOTE — Telephone Encounter (Signed)
Medical records received from [facility]. 08/24/18 vlm

## 2018-08-25 ENCOUNTER — Other Ambulatory Visit: Payer: Self-pay | Admitting: Physician Assistant

## 2018-08-27 NOTE — Telephone Encounter (Signed)
Last RX sent 07/30/18  Last OV 05/23/18  RXs pended, please advise

## 2018-08-28 ENCOUNTER — Other Ambulatory Visit: Payer: Self-pay | Admitting: Physician Assistant

## 2018-08-28 MED ORDER — AMPHETAMINE-DEXTROAMPHETAMINE 30 MG PO TABS: 30.0000 mg | ORAL_TABLET | Freq: Every day | ORAL | 0 refills | Status: DC

## 2018-08-28 MED ORDER — AMPHETAMINE-DEXTROAMPHET ER 30 MG PO CP24: 30.0000 mg | ORAL_CAPSULE | ORAL | 0 refills | Status: DC

## 2018-08-28 MED ORDER — METOPROLOL SUCCINATE ER 25 MG PO TB24: ORAL_TABLET | ORAL | 0 refills | Status: DC

## 2018-08-30 ENCOUNTER — Ambulatory Visit (HOSPITAL_BASED_OUTPATIENT_CLINIC_OR_DEPARTMENT_OTHER)
Admission: RE | Admit: 2018-08-30 | Payer: BLUE CROSS/BLUE SHIELD | Source: Home / Self Care | Admitting: Orthopaedic Surgery

## 2018-08-30 ENCOUNTER — Encounter (HOSPITAL_BASED_OUTPATIENT_CLINIC_OR_DEPARTMENT_OTHER): Admission: RE | Payer: Self-pay | Source: Home / Self Care

## 2018-08-30 SURGERY — SHOULDER ARTHROSCOPY WITH SUBACROMIAL DECOMPRESSION, ROTATOR CUFF REPAIR AND BICEP TENDON REPAIR
Anesthesia: Choice | Laterality: Left

## 2018-09-21 ENCOUNTER — Other Ambulatory Visit: Payer: Self-pay | Admitting: Sports Medicine

## 2018-09-21 DIAGNOSIS — M2550 Pain in unspecified joint: Secondary | ICD-10-CM

## 2018-09-27 ENCOUNTER — Other Ambulatory Visit: Payer: Self-pay | Admitting: Physician Assistant

## 2018-09-28 ENCOUNTER — Encounter: Payer: Self-pay | Admitting: Physician Assistant

## 2018-09-28 MED ORDER — AMPHETAMINE-DEXTROAMPHET ER 30 MG PO CP24: 30.0000 mg | ORAL_CAPSULE | ORAL | 0 refills | Status: DC

## 2018-09-28 MED ORDER — AMPHETAMINE-DEXTROAMPHETAMINE 30 MG PO TABS: 30.0000 mg | ORAL_TABLET | Freq: Every day | ORAL | 0 refills | Status: DC

## 2018-10-09 ENCOUNTER — Telehealth: Payer: Self-pay | Admitting: *Deleted

## 2018-10-09 NOTE — Telephone Encounter (Signed)
lmtcb to schedule virtual visit w/ Dr. Curt Bears (last seen 05/2018)

## 2018-10-27 ENCOUNTER — Encounter: Payer: Self-pay | Admitting: Physician Assistant

## 2018-10-27 ENCOUNTER — Other Ambulatory Visit: Payer: Self-pay | Admitting: Physician Assistant

## 2018-10-28 ENCOUNTER — Other Ambulatory Visit: Payer: Self-pay | Admitting: Physician Assistant

## 2018-10-30 MED ORDER — AMPHETAMINE-DEXTROAMPHETAMINE 30 MG PO TABS: 30.0000 mg | ORAL_TABLET | Freq: Every day | ORAL | 0 refills | Status: DC

## 2018-10-30 MED ORDER — AMPHETAMINE-DEXTROAMPHET ER 30 MG PO CP24: 30.0000 mg | ORAL_CAPSULE | ORAL | 0 refills | Status: DC

## 2018-10-30 NOTE — Telephone Encounter (Signed)
Pt sent MyChart msg stating:  "Good morning, Jade! I sent a request for a my adderall to be refilled. I am having my shoulder surgery on Monday. I'll come see you at the beginning of June. I hope you are well. "  RX pended. Please advise

## 2018-11-08 NOTE — Telephone Encounter (Signed)
lmtcb

## 2018-11-15 ENCOUNTER — Encounter: Payer: Self-pay | Admitting: Family Medicine

## 2018-11-15 ENCOUNTER — Ambulatory Visit (INDEPENDENT_AMBULATORY_CARE_PROVIDER_SITE_OTHER): Payer: 59 | Admitting: Family Medicine

## 2018-11-15 VITALS — BP 124/77 | HR 74 | Temp 98.6°F | Wt 184.0 lb

## 2018-11-15 DIAGNOSIS — K148 Other diseases of tongue: Secondary | ICD-10-CM

## 2018-11-15 NOTE — Progress Notes (Signed)
Jacob Cohen is a 48 y.o. male who presents to [facility] today for spots on tongue since February.  It initially started as red bumps that have now become white flat and a bit bigger.  They are mostly confined to the left side of his tongue.  He has a history of oral tobacco use.  He started when he was 15 and quit 1 year ago.  He denies any fevers or chills night sweats weight loss lumps in neck or chest.  He feels well otherwise.  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     ROS as above:  Exam:  BP 124/77   Pulse 74   Temp 98.6 F (37 C) (Oral)   Wt 184 lb (83.5 kg)   BMI 24.95 kg/m  Wt Readings from Last 5 Encounters:  11/15/18 184 lb (83.5 kg)  08/09/18 172 lb 13.5 oz (78.4 kg)  07/02/18 182 lb (82.6 kg)  05/23/18 179 lb (81.2 kg)  05/21/18 181 lb (82.1 kg)    Gen: Well NAD HEENT: EOMI,  MMM slightly raised whitish lesion left side of tongue not removable.  No lymphadenopathy cervical or supraclavicular region.  Normal neck motion.  No other visible oral lesions present. Lungs: Normal work of breathing. CTABL Heart: RRR no MRG Abd: NABS, Soft. Nondistended, Nontender Exts: Brisk capillary refill, warm and well perfused.     Assessment and Plan: 48 y.o. male with oral lesion in the setting of history of tobacco use.  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  This will likely benefit from biopsy.  Given tongue is a very vascular organ and we have a prompt ready access to ENT specialty.  Plan to refer to ENT for evaluation and likely biopsy.  Recheck if needed.  PDMP not reviewed this encounter. Orders Placed This Encounter  Procedures  . Ambulatory referral to ENT    Referral Priority:   Routine    Referral Type:   Consultation    Referral Reason:   Specialty Services Required    Requested Specialty:   Otolaryngology    Number of Visits  Requested:   1   No orders of the defined types were placed in this encounter.    Historical information moved to improve visibility of documentation.  Past Medical History:  Diagnosis Date  . ADHD   . Paroxysmal SVT (supraventricular tachycardia) (HCC)    s/p ablation  . Syncope    Past Surgical History:  Procedure Laterality Date  . CARDIAC ELECTROPHYSIOLOGY STUDY AND ABLATION  2012  . HERNIA REPAIR    . KNEE ARTHROSCOPY WITH MEDIAL MENISECTOMY Right 08/09/2018   Procedure: RIGHT KNEE ARTHROSCOPY WITH MEDIAL MENISECTOMY;  Surgeon: Hiram Gash, MD;  Location: [facility];  Service: Orthopedics;  Laterality: Right;  . KNEE SURGERY Left 2000   Social History   Tobacco Use  . Smoking status: Never Smoker  . Smokeless tobacco: Former Network engineer Use Topics  . Alcohol use: Yes    Comment: 2-4 beers on the weekend   family history includes Alzheimer's disease in his maternal grandfather; COPD in his paternal grandfather; Cancer in his maternal grandmother; Hypertension in his mother.  Medications: Current Outpatient Medications  Medication Sig Dispense Refill  . amphetamine-dextroamphetamine (ADDERALL XR) 30 MG 24 hr capsule Take 1 capsule (30 mg total) by mouth every morning. 30 capsule 0  . amphetamine-dextroamphetamine (ADDERALL) 30 MG tablet Take 1 tablet  by mouth daily. At 2pm. 30 tablet 0  . Ascorbic Acid (VITAMIN C PO) Take by mouth.    . celecoxib (CELEBREX) 200 MG capsule ONE TO 2 TABLETS BY MOUTH DAILY AS NEEDED FOR PAIN. 60 capsule 2  . metoprolol succinate (TOPROL-XL) 25 MG 24 hr tablet TAKE 1 TABLET BY MOUTH EVERY DAY 90 tablet 1   No current facility-administered medications for this visit.    No Known Allergies   Discussed warning signs or symptoms. Please see discharge instructions. Patient expresses understanding.

## 2018-11-15 NOTE — Patient Instructions (Signed)
Thank you for coming in today. You should hear about ENT referral.  Let me know if you do not hear anything.  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    Leukoplakia Leukoplakia refers to white patches that develop in your mouth. These patches may show up on the insides of your cheeks, on your lips, on or under your tongue, or on your gums. Leukoplakia can also develop on the genitals or in the area around the anus, but this is rare. Leukoplakia usually goes away with treatment. In some cases, leukoplakia can indicate an increased risk for cancer. What are the causes? Many conditions can cause or increase the risk for leukoplakia in the mouth. These may include:  Any type of tobacco use, especially when combined with the use of alcohol.  Irritation of the mouth from rough teeth or dentures.  Having a weakened disease-fighting system (immune system). What are the signs or symptoms? The main symptom of this condition is the development of patches or flat areas in the mouth. These patches may:  Have an odd shape.  Be hard.  Be raised.  Be white, gray, or speckled red and white in color. Some areas may be reddened.  Be hard to wipe or scrape away. Scraping the patches may cause bleeding.  Be sensitive to touch, heat, or foods that are spicy or acidic. How is this diagnosed? This condition is diagnosed based on:  A physical exam. Your health care provider can usually make a diagnosis by closely examining the affected area.  A test in which a sample of affected skin is removed and then checked under a microscope (biopsy). This test is used to confirm the diagnosis and to rule out other serious conditions, such as cancer. How is this treated? Treatment for this condition may include:  Stopping tobacco use.  Repairing any rough teeth or dentures.  Surgery to remove the patches. This may be done using a surgical knife (scalpel), laser, heat, or cold.  Medicines  that can be taken by mouth.  Medicines that can be applied to the patches. Follow these instructions at home: Eating and drinking  Avoid foods or drinks that seem to irritate the patches.  Eat a healthy diet including fresh fruits and vegetables, lean proteins, and whole grains. General instructions   Take or apply over-the-counter and prescription medicines only as told by your health care provider.  Check with your dentist to see if you need any repairs to teeth or dentures.  Do not use any products that contain nicotine or tobacco, such as cigarettes and e-cigarettes. If you need help quitting, ask your health care provider.  Keep all follow-up visits as told by your health care provider. This is important. Alcohol use  Do not drink alcohol if: ? Your health care provider tells you not to drink. ? You are pregnant, may be pregnant, or are planning to become pregnant.  If you drink alcohol, limit how much you have: ? 0-1 drink a day for women. ? 0-2 drinks a day for men.  Be aware of how much alcohol is in your drink. In the [place] one drink equals one typical bottle of beer (12 oz), one-half glass of wine (5 oz), or one shot of hard liquor (1 oz). Contact a health care provider if you:  Develop new patches of leukoplakia.  Notice changes in the size, shape, or feel of existing patches.  Have a fever. Get help right away if you have:  Severe pain in the area of a patch, and the pain is not helped by prescribed medicine.  Bleeding in the area of a patch, and you cannot stop the bleeding.  A patch in your mouth that becomes so swollen that you have trouble eating or breathing. Summary  Leukoplakia refers to white patches that develop in your mouth. It can also occur in the genitals or in the area around the anus, but this is rare.  A health care provider can usually diagnose this condition by closely examining the affected area. To confirm the diagnosis, a sample of  affected skin may be removed and then checked under a microscope (biopsy).  Leukoplakia usually goes away with treatment. In some cases, leukoplakia can indicate an increased risk for cancer.  Treatment may include stopping tobacco use or repairing any rough teeth or dentures. You may also use medicines or have surgery to remove the patches. This information is not intended to replace advice given to you by your health care provider. Make sure you discuss any questions you have with your health care provider. Document Released: 05/05/2008 Document Revised: 05/16/2017 Document Reviewed: 05/16/2017 Elsevier Interactive Patient Education  2019 [employer].

## 2018-11-29 ENCOUNTER — Other Ambulatory Visit: Payer: Self-pay | Admitting: Physician Assistant

## 2018-11-30 MED ORDER — AMPHETAMINE-DEXTROAMPHETAMINE 30 MG PO TABS: 30.0000 mg | ORAL_TABLET | Freq: Every day | ORAL | 0 refills | Status: DC

## 2018-11-30 MED ORDER — AMPHETAMINE-DEXTROAMPHET ER 30 MG PO CP24: 30.0000 mg | ORAL_CAPSULE | ORAL | 0 refills | Status: DC

## 2018-11-30 NOTE — Telephone Encounter (Signed)
Please schedule patient for virtual visit.

## 2018-11-30 NOTE — Telephone Encounter (Signed)
Needs appt can make virtual

## 2018-11-30 NOTE — Telephone Encounter (Signed)
Appointment has been made for Monday but patient wanted to know if he can get a refill since he is out until Monday. Please advise.

## 2018-12-03 ENCOUNTER — Ambulatory Visit: Payer: 59 | Admitting: Physician Assistant

## 2018-12-04 NOTE — Telephone Encounter (Signed)
lmtcb

## 2018-12-30 ENCOUNTER — Other Ambulatory Visit: Payer: Self-pay | Admitting: Physician Assistant

## 2019-01-01 NOTE — Telephone Encounter (Signed)
Needs follow up. Can be virtual.

## 2019-01-03 ENCOUNTER — Encounter: Payer: Self-pay | Admitting: Physician Assistant

## 2019-01-03 NOTE — Telephone Encounter (Signed)
Call pt for appt prior to refills

## 2019-01-03 NOTE — Telephone Encounter (Signed)
Left voicemail with information below. Let patient know to call us back to schedule an office visit or a virtual.

## 2019-01-04 ENCOUNTER — Other Ambulatory Visit: Payer: Self-pay

## 2019-01-04 ENCOUNTER — Telehealth (INDEPENDENT_AMBULATORY_CARE_PROVIDER_SITE_OTHER): Payer: No Typology Code available for payment source | Admitting: Physician Assistant

## 2019-01-04 ENCOUNTER — Encounter: Payer: Self-pay | Admitting: Physician Assistant

## 2019-01-04 VITALS — Temp 97.4°F | Ht 72.0 in | Wt 184.0 lb

## 2019-01-04 DIAGNOSIS — F902 Attention-deficit hyperactivity disorder, combined type: Secondary | ICD-10-CM

## 2019-01-04 DIAGNOSIS — Z8679 Personal history of other diseases of the circulatory system: Secondary | ICD-10-CM | POA: Diagnosis not present

## 2019-01-04 DIAGNOSIS — R002 Palpitations: Secondary | ICD-10-CM

## 2019-01-04 MED ORDER — AMPHETAMINE-DEXTROAMPHET ER 30 MG PO CP24: 30.0000 mg | ORAL_CAPSULE | Freq: Every day | ORAL | 0 refills | Status: DC

## 2019-01-04 MED ORDER — AMPHETAMINE-DEXTROAMPHET ER 30 MG PO CP24: 30.0000 mg | ORAL_CAPSULE | ORAL | 0 refills | Status: DC

## 2019-01-04 MED ORDER — AMPHETAMINE-DEXTROAMPHETAMINE 30 MG PO TABS: 30.0000 mg | ORAL_TABLET | Freq: Every day | ORAL | 0 refills | Status: DC

## 2019-01-04 MED ORDER — METOPROLOL SUCCINATE ER 25 MG PO TB24: 25.0000 mg | ORAL_TABLET | Freq: Every day | ORAL | 4 refills | Status: DC

## 2019-01-04 NOTE — Progress Notes (Signed)
Patient ID: Elek Holderness, male   DOB: [birth year], 48 y.o.   MRN: [id number] .Marland KitchenVirtual Visit via Telephone Note  I connected with [name] on 01/04/19 at  3:00 PM EDT by telephone and verified that I am speaking with the correct person using two identifiers.  Location: Patient: home Provider: clinic   I discussed the limitations, risks, security and privacy concerns of performing an evaluation and management service by telephone and the availability of in person appointments. I also discussed with the patient that there may be a patient responsible charge related to this service. The patient expressed understanding and agreed to proceed.   History of Present Illness: Pt is a 48 yo male with ADHD and palpitations who calls into the clinic for refills.   He is doing well. Trying to recover from shoulder and knee surgery. His focus is pretty good. No palpitations as long as he take metoprolol. No headaches.   .. Active Ambulatory Problems    Diagnosis Date Noted  . Lipoma of torso 07/06/2016  . ADHD (attention deficit hyperactivity disorder), combined type 07/06/2016  . Palpitations 07/06/2016  . History of PSVT (paroxysmal supraventricular tachycardia) 07/06/2016  . Heart disease   . Tinea versicolor 12/08/2017  . 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 Polyarthralgia 07/02/2018  . Left shoulder pain 07/02/2018   Resolved Ambulatory Problems    Diagnosis Date Noted  . No Resolved Ambulatory Problems   Past Medical History:  Diagnosis Date  . ADHD   . Paroxysmal SVT (supraventricular tachycardia) ([facility])   . Syncope    Reviewed med, allergy, problem list.    Observations/Objective: No acute distress.  Normal mood.   .. Today's Vitals   01/04/19 1428  Temp: (!) 97.4 F (36.3 C)  TempSrc: Oral  Weight: 184 lb (83.5 kg)  Height: 6' (1.829 m)   Body mass index is 24.95 kg/m.   Assessment and Plan: Marland KitchenMarland KitchenDiagnoses and all orders for this  visit:  Palpitations -     metoprolol succinate (TOPROL-XL) 25 MG 24 hr tablet; Take 1 tablet (25 mg total) by mouth daily.  ADHD (attention deficit hyperactivity disorder), combined type -     amphetamine-dextroamphetamine (ADDERALL) 30 MG tablet; Take 1 tablet by mouth daily. At 2pm. -     amphetamine-dextroamphetamine (ADDERALL XR) 30 MG 24 hr capsule; Take 1 capsule (30 mg total) by mouth every morning. -     amphetamine-dextroamphetamine (ADDERALL XR) 30 MG 24 hr capsule; Take 1 capsule (30 mg total) by mouth every morning. -     amphetamine-dextroamphetamine (ADDERALL) 30 MG tablet; Take 1 tablet by mouth daily. At 2pm -     amphetamine-dextroamphetamine (ADDERALL) 30 MG tablet; Take 1 tablet by mouth daily. At 2pm -     amphetamine-dextroamphetamine (ADDERALL XR) 30 MG 24 hr capsule; Take 1 capsule (30 mg total) by mouth daily.  History of PSVT (paroxysmal supraventricular tachycardia) -     metoprolol succinate (TOPROL-XL) 25 MG 24 hr tablet; Take 1 tablet (25 mg total) by mouth daily.  refilled adderall for 3 months.   Refilled metoprolol for 1 year.    Follow Up Instructions:    I discussed the assessment and treatment plan with the patient. The patient was provided an opportunity to ask questions and all were answered. The patient agreed with the plan and demonstrated an understanding of the instructions.   The patient was advised to call back or seek an in-person evaluation if the symptoms worsen or  if the condition fails to improve as anticipated.  I provided 7 minutes of non-face-to-face time during this encounter.   Iran Planas, PA-C

## 2019-01-04 NOTE — Progress Notes (Signed)
Patient doing well.   Following up for refills.

## 2019-01-30 ENCOUNTER — Ambulatory Visit (INDEPENDENT_AMBULATORY_CARE_PROVIDER_SITE_OTHER): Payer: No Typology Code available for payment source

## 2019-01-30 ENCOUNTER — Ambulatory Visit (INDEPENDENT_AMBULATORY_CARE_PROVIDER_SITE_OTHER): Payer: No Typology Code available for payment source | Admitting: Sports Medicine

## 2019-01-30 ENCOUNTER — Other Ambulatory Visit: Payer: Self-pay

## 2019-01-30 DIAGNOSIS — S79811A Other specified injuries of right hip, initial encounter: Secondary | ICD-10-CM

## 2019-01-30 DIAGNOSIS — G8929 Other chronic pain: Secondary | ICD-10-CM | POA: Insufficient documentation

## 2019-01-30 DIAGNOSIS — M533 Sacrococcygeal disorders, not elsewhere classified: Secondary | ICD-10-CM | POA: Insufficient documentation

## 2019-01-30 MED ORDER — HYDROCODONE-ACETAMINOPHEN 5-325 MG PO TABS: 1.0000 | ORAL_TABLET | Freq: Three times a day (TID) | ORAL | 0 refills | Status: DC | PRN

## 2019-01-30 NOTE — Progress Notes (Signed)
Subjective:    CC: Right hip pain  HPI: Jacob Cohen is a pleasant 48 year old male, a month ago he slipped and fell onto hardwood floor directly over his right greater trochanter.  He was unable to walk for several days, subsequently pain improved slightly but he still has a significant limp, severe pain laterally.  Localized without radiation.  I reviewed the past medical history, family history, social history, surgical history, and allergies today and no changes were needed.  Please see the problem list section below in epic for further details.  Past Medical History: Past Medical History:  Diagnosis Date  . ADHD   . Paroxysmal SVT (supraventricular tachycardia) ([facility])    s/p ablation  . Syncope    Past Surgical History: Past Surgical History:  Procedure Laterality Date  . CARDIAC ELECTROPHYSIOLOGY STUDY AND ABLATION  2012  . HERNIA REPAIR    . KNEE ARTHROSCOPY WITH MEDIAL MENISECTOMY Right 08/09/2018   Procedure: RIGHT KNEE ARTHROSCOPY WITH MEDIAL MENISECTOMY;  Surgeon: Hiram Gash, MD;  Location: [facility];  Service: Orthopedics;  Laterality: Right;  . KNEE SURGERY Left 2000   Social History: Social History   Socioeconomic History  . Marital status: Divorced    Spouse name: Not on file  . Number of children: Not on file  . Years of education: Not on file  . Highest education level: Not on file  Occupational History  . Not on file  Social Needs  . Financial resource strain: Not on file  . Food insecurity    Worry: Not on file    Inability: Not on file  . Transportation needs    Medical: Not on file    Non-medical: Not on file  Tobacco Use  . Smoking status: Never Smoker  . Smokeless tobacco: Former Network engineer and Sexual Activity  . Alcohol use: Yes    Comment: 2-4 beers on the weekend  . Drug use: No  . Sexual activity: Yes  Lifestyle  . Physical activity    Days per week: Not on file    Minutes per session: Not on file  . Stress: Not on  file  Relationships  . Social Herbalist on phone: Not on file    Gets together: Not on file    Attends religious service: Not on file    Active member of club or organization: Not on file    Attends meetings of clubs or organizations: Not on file    Relationship status: Not on file  Other Topics Concern  . Not on file  Social History Narrative  . Not on file   Family History: Family History  Problem Relation Age of Onset  . Hypertension Mother   . Cancer Maternal Grandmother   . Alzheimer's disease Maternal Grandfather   . COPD Paternal Grandfather    Allergies: No Known Allergies Medications: See med rec.  Review of Systems: No fevers, chills, night sweats, weight loss, chest pain, or shortness of breath.   Objective:    General: Well Developed, well nourished, and in no acute distress.  Neuro: Alert and oriented x3, extra-ocular muscles intact, sensation grossly intact.  HEENT: Normocephalic, atraumatic, pupils equal round reactive to light, neck supple, no masses, no lymphadenopathy, thyroid nonpalpable.  Skin: Warm and dry, no rashes. Cardiac: Regular rate and rhythm, no murmurs rubs or gallops, no lower extremity edema.  Respiratory: Clear to auscultation bilaterally. Not using accessory muscles, speaking in full sentences. Right hip: ROM IR: 60  Deg, ER: 60 Deg, Flexion: 120 Deg, Extension: 100 Deg, Abduction: 45 Deg, Adduction: 45 Deg Strength IR: 5/5, ER: 5/5, Flexion: 5/5, Extension: 5/5, Abduction: 5/5, Adduction: 5/5 Subjectively and objectively weak to abduction. Pelvic alignment unremarkable to inspection and palpation. Standing hip rotation and gait without trendelenburg / unsteadiness. Severe tenderness over the greater trochanter. No tenderness over piriformis. No SI joint tenderness and normal minimal SI movement.  Impression and Recommendations:    Blunt trauma of hip, right, initial encounter Crutches, hydrocodone, x-rays. If  unrevealing x-rays we will add a CT of the pelvis. Continue nonweightbearing/touchdown weightbearing for at least 2 weeks.   ___________________________________________ Gwen Her. Dianah Field, M.D., ABFM., CAQSM. Primary Care and Sports Medicine [facility]  Adjunct Professor of [employer] [facility] [employer]

## 2019-01-30 NOTE — Assessment & Plan Note (Signed)
Crutches, hydrocodone, x-rays. If unrevealing x-rays we will add a CT of the pelvis. Continue nonweightbearing/touchdown weightbearing for at least 2 weeks.

## 2019-02-04 ENCOUNTER — Encounter: Payer: Self-pay | Admitting: Physician Assistant

## 2019-02-04 ENCOUNTER — Other Ambulatory Visit: Payer: Self-pay

## 2019-02-04 ENCOUNTER — Telehealth (INDEPENDENT_AMBULATORY_CARE_PROVIDER_SITE_OTHER): Payer: No Typology Code available for payment source | Admitting: Physician Assistant

## 2019-02-04 ENCOUNTER — Telehealth: Payer: Self-pay

## 2019-02-04 VITALS — Ht 72.0 in | Wt 181.0 lb

## 2019-02-04 DIAGNOSIS — R531 Weakness: Secondary | ICD-10-CM

## 2019-02-04 DIAGNOSIS — Z20822 Contact with and (suspected) exposure to covid-19: Secondary | ICD-10-CM

## 2019-02-04 DIAGNOSIS — R0789 Other chest pain: Secondary | ICD-10-CM | POA: Diagnosis not present

## 2019-02-04 DIAGNOSIS — R05 Cough: Secondary | ICD-10-CM | POA: Diagnosis not present

## 2019-02-04 DIAGNOSIS — R059 Cough, unspecified: Secondary | ICD-10-CM

## 2019-02-04 DIAGNOSIS — R430 Anosmia: Secondary | ICD-10-CM

## 2019-02-04 DIAGNOSIS — R52 Pain, unspecified: Secondary | ICD-10-CM | POA: Diagnosis not present

## 2019-02-04 MED ORDER — AMBULATORY NON FORMULARY MEDICATION: 0 refills | Status: DC

## 2019-02-04 NOTE — Telephone Encounter (Signed)
Ok printed order

## 2019-02-04 NOTE — Telephone Encounter (Signed)
Jacob Cohen has cough, wheezing and body aches. He has been scheduled for virtual visit today. He is wanting to be tested at [facility]. I called and left a message with [name], with [facility], to find out what they will need from [place] as far as an order.    [facility] Address: [address], [place], [place] [address]  Phone: [phone]

## 2019-02-04 NOTE — Progress Notes (Signed)
Patient ID: Jacob Cohen, male   DOB: 1971/03/20, 48 y.o.   MRN: [id number] .Marland KitchenVirtual Visit via Video Note  I connected with [name] on 02/04/19 at  1:00 PM EDT by a video enabled telemedicine application and verified that I am speaking with the correct person using two identifiers.  Location: Patient: home Provider: clinic   I discussed the limitations of evaluation and management by telemedicine and the availability of in person appointments. The patient expressed understanding and agreed to proceed.  History of Present Illness: Pt is a 48 yo male who calls into the clinic with covid symptoms. His symptoms started 2 days ago with weakness and body aches. Progressed into cough, wheezing, body aches, lack of smell, shortness of breath, loss of smell. He has no known exposure. No other sick contacts. He does work in Scientist, research (physical sciences). He has been taking tylenol cold sinus severe with little relief.   .. Active Ambulatory Problems    Diagnosis Date Noted  . Lipoma of torso 07/06/2016  . ADHD (attention deficit hyperactivity disorder), combined type 07/06/2016  . Palpitations 07/06/2016  . History of PSVT (paroxysmal supraventricular tachycardia) 07/06/2016  . Heart disease   . Tinea versicolor 12/08/2017  . 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 Polyarthralgia 07/02/2018  . Left shoulder pain 07/02/2018  . Blunt trauma of hip, right, initial encounter 01/30/2019  . 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 Weakness 02/04/2019   Resolved Ambulatory Problems    Diagnosis Date Noted  . No Resolved Ambulatory Problems   Past Medical History:  Diagnosis Date  . ADHD   . Paroxysmal SVT (supraventricular tachycardia) ([facility])   . Syncope     Reviewed med, allergies, problem list.    Observations/Objective: No acute distress. No labored breathing.  Dry cough.  Appeared weak and fatigued.   Not able to get any vitals.    Assessment and Plan: Marland KitchenMarland KitchenMarquette was seen  today for cough.  Diagnoses and all orders for this visit:  Cough  Chest tightness  Body aches  Weakness  No sense of smell   Pt has covid symptoms. Self isolate with those in home already exposed until test results come back and fever free for 3 days. Testing already done. Will await results. Discussed symptomatic care for virus. He has albuterol at home. Use as needed. Rest and hydrate. If any worsening SOB or problems breathing go to ED.   Follow Up Instructions:    I discussed the assessment and treatment plan with the patient. The patient was provided an opportunity to ask questions and all were answered. The patient agreed with the plan and demonstrated an understanding of the instructions.   The patient was advised to call back or seek an in-person evaluation if the symptoms worsen or if the condition fails to improve as anticipated.    Iran Planas, PA-C

## 2019-02-04 NOTE — Progress Notes (Deleted)
Weakness started 2 days ago Started yesterday: Cough, wheezing, body aches, lack of smell, SOB Taking tylenol/nyquil No exposure to Covid (that he is aware of), no fevers

## 2019-02-05 ENCOUNTER — Encounter: Payer: Self-pay | Admitting: Physician Assistant

## 2019-02-05 LAB — NOVEL CORONAVIRUS, NAA: SARS-CoV-2, NAA: NOT DETECTED

## 2019-02-08 ENCOUNTER — Other Ambulatory Visit: Payer: Self-pay | Admitting: Physician Assistant

## 2019-02-08 DIAGNOSIS — F902 Attention-deficit hyperactivity disorder, combined type: Secondary | ICD-10-CM

## 2019-02-12 ENCOUNTER — Other Ambulatory Visit: Payer: Self-pay | Admitting: Physician Assistant

## 2019-02-12 DIAGNOSIS — F902 Attention-deficit hyperactivity disorder, combined type: Secondary | ICD-10-CM

## 2019-02-13 ENCOUNTER — Ambulatory Visit: Payer: No Typology Code available for payment source | Admitting: Sports Medicine

## 2019-02-13 MED ORDER — AMPHETAMINE-DEXTROAMPHETAMINE 30 MG PO TABS: 30.0000 mg | ORAL_TABLET | Freq: Every day | ORAL | 0 refills | Status: DC

## 2019-02-13 MED ORDER — AMPHETAMINE-DEXTROAMPHET ER 30 MG PO CP24: 30.0000 mg | ORAL_CAPSULE | ORAL | 0 refills | Status: DC

## 2019-02-14 ENCOUNTER — Encounter: Payer: Self-pay | Admitting: Physician Assistant

## 2019-02-15 ENCOUNTER — Encounter: Payer: Self-pay | Admitting: Physician Assistant

## 2019-02-26 ENCOUNTER — Other Ambulatory Visit: Payer: Self-pay

## 2019-02-26 ENCOUNTER — Encounter: Payer: Self-pay | Admitting: Physician Assistant

## 2019-02-26 ENCOUNTER — Ambulatory Visit (INDEPENDENT_AMBULATORY_CARE_PROVIDER_SITE_OTHER): Payer: No Typology Code available for payment source

## 2019-02-26 ENCOUNTER — Ambulatory Visit (INDEPENDENT_AMBULATORY_CARE_PROVIDER_SITE_OTHER): Payer: No Typology Code available for payment source | Admitting: Physician Assistant

## 2019-02-26 VITALS — Temp 98.3°F | Ht 72.0 in | Wt 181.0 lb

## 2019-02-26 DIAGNOSIS — R05 Cough: Secondary | ICD-10-CM

## 2019-02-26 DIAGNOSIS — R432 Parageusia: Secondary | ICD-10-CM

## 2019-02-26 DIAGNOSIS — R430 Anosmia: Secondary | ICD-10-CM

## 2019-02-26 DIAGNOSIS — R0789 Other chest pain: Secondary | ICD-10-CM | POA: Diagnosis not present

## 2019-02-26 DIAGNOSIS — R059 Cough, unspecified: Secondary | ICD-10-CM

## 2019-02-26 DIAGNOSIS — J0140 Acute pansinusitis, unspecified: Secondary | ICD-10-CM

## 2019-02-26 DIAGNOSIS — R5383 Other fatigue: Secondary | ICD-10-CM

## 2019-02-26 MED ORDER — HYDROCOD POLST-CPM POLST ER 10-8 MG/5ML PO SUER: 5.0000 mL | Freq: Two times a day (BID) | ORAL | 0 refills | Status: DC | PRN

## 2019-02-26 MED ORDER — AZITHROMYCIN 250 MG PO TABS: ORAL_TABLET | ORAL | 0 refills | Status: DC

## 2019-02-26 NOTE — Progress Notes (Signed)
Patient ID: Jacob Cohen, male   DOB: 03-12-1971, 48 y.o.   MRN: [id number] .Marland KitchenVirtual Visit via Video Note  I connected with Jacob Cohen on 02/27/19 at  9:50 AM EDT by a video enabled telemedicine application and verified that I am speaking with the correct person using two identifiers.  Location: Patient: home Provider: clinic   I discussed the limitations of evaluation and management by telemedicine and the availability of in person appointments. The patient expressed understanding and agreed to proceed.  History of Present Illness: Patient is a 48 year old male who calls into the clinic to discuss ongoing cough, sinus pressure, fatigue.  About 1 month ago patient had COVID-like symptoms with the most notable one being loss of smell and taste.  He did test negative for COVID 3 weeks ago.  About 2 weeks he started to feel a little bit better and even went back to work.  After a few days he then began to feel worse again.  He is having a lot of sinus pressure and congestion.  He is having to use Afrin regularly just to keep his congestion down.  He feels completely worn out.  He denies any other sick contacts in the home.  He denies any known COVID exposure.  He denies any GI symptoms.  He denies any shortness of breath or swelling in extremities.  .. Active Ambulatory Problems    Diagnosis Date Noted  . Lipoma of torso 07/06/2016  . ADHD (attention deficit hyperactivity disorder), combined type 07/06/2016  . Palpitations 07/06/2016  . History of PSVT (paroxysmal supraventricular tachycardia) 07/06/2016  . Heart disease   . Tinea versicolor 12/08/2017  . 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 Polyarthralgia 07/02/2018  . Left shoulder pain 07/02/2018  . Blunt trauma of hip, right, initial encounter 01/30/2019  . 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 Weakness 02/04/2019   Resolved Ambulatory Problems    Diagnosis Date Noted  . No Resolved Ambulatory Problems    Past Medical History:  Diagnosis Date  . ADHD   . Paroxysmal SVT (supraventricular tachycardia) ([facility])   . Syncope    Reviewed med, allergy, problem list.    Observations/Objective: No acute distress. Productive cough on video. Nasal congestion and sinus congestion heard.  No labored breathing.  Laying in bed appearing fatigued and weak. Pale in appearance.   .. Today's Vitals   02/26/19 0926  Temp: 98.3 F (36.8 C)  TempSrc: Oral  Weight: 181 lb (82.1 kg)  Height: 6' (1.829 m)   Body mass index is 24.55 kg/m.    Assessment and Plan: Marland KitchenMarland KitchenKaynon was seen today for cough.  Diagnoses and all orders for this visit:  Acute non-recurrent pansinusitis -     CBC with Differential/Platelet -     COMPLETE METABOLIC PANEL WITH GFR -     azithromycin (ZITHROMAX) 250 MG tablet; Take 2 tablets now and then one tablet for 4 days.  Cough -     chlorpheniramine-HYDROcodone (TUSSIONEX) 10-8 MG/5ML SUER; Take 5 mLs by mouth every 12 (twelve) hours as needed. -     CBC with Differential/Platelet -     COMPLETE METABOLIC PANEL WITH GFR -     DG Chest 2 View  Loss of smell -     CBC with Differential/Platelet -     COMPLETE METABOLIC PANEL WITH GFR  Loss of taste -     CBC with Differential/Platelet -     COMPLETE METABOLIC PANEL WITH GFR  Chest tightness -     CBC with Differential/Platelet -     COMPLETE METABOLIC PANEL WITH GFR -     DG Chest 2 View  Fatigue, unspecified type -     CBC with Differential/Platelet -     COMPLETE METABOLIC PANEL WITH GFR   Certainly patient never tested positive for COVID but his symptoms seem very likely he did have COVID.  I am thinking since he got a little better and then suddenly got worse that he probably has a secondary sinus infection after the viral infection.  I am going to go ahead and treat with azithromycin.  I did send him a cough syrup to help him rest at bedtime.  Encouraged him to use Flonase instead of Afrin as there is a  lot of rebound congestion associated with regular use of Afrin.  I would like to get a stat chest x-ray and metabolic panel just to make sure he should not be monitored more closely if he had pneumonia or if there was any organ failures.  Encourage patient to stay hydrated and continue to rest.  Give me an update via my chart in 2 days.   Follow Up Instructions:    I discussed the assessment and treatment plan with the patient. The patient was provided an opportunity to ask questions and all were answered. The patient agreed with the plan and demonstrated an understanding of the instructions.   The patient was advised to call back or seek an in-person evaluation if the symptoms worsen or if the condition fails to improve as anticipated.    Iran Planas, PA-C

## 2019-02-26 NOTE — Progress Notes (Deleted)
Can't taste or smell ongoing  x 1 month Covid symptoms - started about one month ago, symptoms went away and came back Saturday - cough, sinus issues, weakness, chest tightness. Had Covid test when symptoms first started a month ago that was negative

## 2019-02-26 NOTE — Progress Notes (Signed)
No pneumonia. Lungs look great. I still want you to take zpak sent to pharmacy.

## 2019-02-27 LAB — CBC WITH DIFFERENTIAL/PLATELET
Absolute Monocytes: 558 cells/uL (ref 200–950)
Basophils Absolute: 41 cells/uL (ref 0–200)
Basophils Relative: 0.6 %
Eosinophils Absolute: 286 cells/uL (ref 15–500)
Eosinophils Relative: 4.2 %
HCT: 43.9 % (ref 38.5–50.0)
Hemoglobin: 15 g/dL (ref 13.2–17.1)
Lymphs Abs: 1346 cells/uL (ref 850–3900)
MCH: 33.2 pg — ABNORMAL HIGH (ref 27.0–33.0)
MCHC: 34.2 g/dL (ref 32.0–36.0)
MCV: 97.1 fL (ref 80.0–100.0)
MPV: 11.1 fL (ref 7.5–12.5)
Monocytes Relative: 8.2 %
Neutro Abs: 4570 cells/uL (ref 1500–7800)
Neutrophils Relative %: 67.2 %
Platelets: 212 10*3/uL (ref 140–400)
RBC: 4.52 10*6/uL (ref 4.20–5.80)
RDW: 11.8 % (ref 11.0–15.0)
Total Lymphocyte: 19.8 %
WBC: 6.8 10*3/uL (ref 3.8–10.8)

## 2019-02-27 LAB — COMPLETE METABOLIC PANEL WITH GFR
AG Ratio: 1.6 (calc) (ref 1.0–2.5)
ALT: 13 U/L (ref 9–46)
AST: 12 U/L (ref 10–40)
Albumin: 4.2 g/dL (ref 3.6–5.1)
Alkaline phosphatase (APISO): 59 U/L (ref 36–130)
BUN: 11 mg/dL (ref 7–25)
CO2: 27 mmol/L (ref 20–32)
Calcium: 9.1 mg/dL (ref 8.6–10.3)
Chloride: 104 mmol/L (ref 98–110)
Creat: 0.98 mg/dL (ref 0.60–1.35)
GFR, Est African American: 107 mL/min/{1.73_m2} (ref >=60)
GFR, Est Non African American: 92 mL/min/{1.73_m2} (ref >=60)
Globulin: 2.6 g/dL (calc) (ref 1.9–3.7)
Glucose, Bld: 130 mg/dL — ABNORMAL HIGH (ref 65–99)
Potassium: 3.9 mmol/L (ref 3.5–5.3)
Sodium: 141 mmol/L (ref 135–146)
Total Bilirubin: 0.6 mg/dL (ref 0.2–1.2)
Total Protein: 6.8 g/dL (ref 6.1–8.1)

## 2019-02-27 NOTE — Progress Notes (Signed)
Sabatino,  WBC are normal. No anemia. Liver and kidney look great. Glucose showing elevated here but since not fasting that is not concerning.   Hopefully with antibiotic you are starting to feel better.   Luvenia Starch

## 2019-03-01 ENCOUNTER — Encounter: Payer: Self-pay | Admitting: Physician Assistant

## 2019-03-04 MED ORDER — ALBUTEROL SULFATE (2.5 MG/3ML) 0.083% IN NEBU: 2.5000 mg | INHALATION_SOLUTION | RESPIRATORY_TRACT | 1 refills | Status: DC | PRN

## 2019-03-04 MED ORDER — PREDNISONE 20 MG PO TABS: ORAL_TABLET | ORAL | 0 refills | Status: DC

## 2019-03-13 ENCOUNTER — Other Ambulatory Visit: Payer: Self-pay | Admitting: Physician Assistant

## 2019-03-13 DIAGNOSIS — F902 Attention-deficit hyperactivity disorder, combined type: Secondary | ICD-10-CM

## 2019-03-14 ENCOUNTER — Other Ambulatory Visit: Payer: Self-pay | Admitting: Physician Assistant

## 2019-03-14 DIAGNOSIS — F902 Attention-deficit hyperactivity disorder, combined type: Secondary | ICD-10-CM

## 2019-03-26 ENCOUNTER — Encounter: Payer: Self-pay | Admitting: Physician Assistant

## 2019-04-17 ENCOUNTER — Encounter: Payer: Self-pay | Admitting: Physician Assistant

## 2019-04-18 MED ORDER — BUDESONIDE-FORMOTEROL FUMARATE 160-4.5 MCG/ACT IN AERO: 2.0000 | INHALATION_SPRAY | Freq: Two times a day (BID) | RESPIRATORY_TRACT | 0 refills | Status: DC

## 2019-04-19 ENCOUNTER — Ambulatory Visit: Payer: No Typology Code available for payment source | Admitting: Physician Assistant

## 2019-04-19 MED ORDER — PREDNISONE 20 MG PO TABS: ORAL_TABLET | ORAL | 0 refills | Status: DC

## 2019-04-19 MED ORDER — DOXYCYCLINE HYCLATE 100 MG PO TABS: 100.0000 mg | ORAL_TABLET | Freq: Two times a day (BID) | ORAL | 0 refills | Status: DC

## 2019-04-19 NOTE — Addendum Note (Signed)
Addended by: Donella Stade on: 04/19/2019 08:57 AM   Modules accepted: Orders

## 2019-05-06 ENCOUNTER — Other Ambulatory Visit: Payer: Self-pay | Admitting: Physician Assistant

## 2019-05-06 DIAGNOSIS — F902 Attention-deficit hyperactivity disorder, combined type: Secondary | ICD-10-CM

## 2019-05-06 MED ORDER — AMPHETAMINE-DEXTROAMPHETAMINE 30 MG PO TABS: 30.0000 mg | ORAL_TABLET | Freq: Every day | ORAL | 0 refills | Status: DC

## 2019-05-06 MED ORDER — AMPHETAMINE-DEXTROAMPHET ER 30 MG PO CP24: 30.0000 mg | ORAL_CAPSULE | ORAL | 0 refills | Status: DC

## 2019-05-28 ENCOUNTER — Other Ambulatory Visit: Payer: Self-pay | Admitting: Physician Assistant

## 2019-06-05 ENCOUNTER — Other Ambulatory Visit: Payer: Self-pay | Admitting: Physician Assistant

## 2019-06-05 DIAGNOSIS — F902 Attention-deficit hyperactivity disorder, combined type: Secondary | ICD-10-CM

## 2019-06-06 NOTE — Telephone Encounter (Signed)
Jacob Cohen can you schedule this patient please.

## 2019-06-06 NOTE — Telephone Encounter (Signed)
Needs OV may be virtual.

## 2019-06-10 ENCOUNTER — Other Ambulatory Visit: Payer: Self-pay | Admitting: Physician Assistant

## 2019-06-10 DIAGNOSIS — F902 Attention-deficit hyperactivity disorder, combined type: Secondary | ICD-10-CM

## 2019-06-10 NOTE — Telephone Encounter (Signed)
Please call patient to schedule for ADD follow up.

## 2019-06-11 ENCOUNTER — Encounter: Payer: Self-pay | Admitting: Physician Assistant

## 2019-06-11 ENCOUNTER — Ambulatory Visit (INDEPENDENT_AMBULATORY_CARE_PROVIDER_SITE_OTHER): Payer: No Typology Code available for payment source | Admitting: Physician Assistant

## 2019-06-11 DIAGNOSIS — M1711 Unilateral primary osteoarthritis, right knee: Secondary | ICD-10-CM | POA: Diagnosis not present

## 2019-06-11 DIAGNOSIS — Z760 Encounter for issue of repeat prescription: Secondary | ICD-10-CM | POA: Diagnosis not present

## 2019-06-11 DIAGNOSIS — M2550 Pain in unspecified joint: Secondary | ICD-10-CM

## 2019-06-11 DIAGNOSIS — F902 Attention-deficit hyperactivity disorder, combined type: Secondary | ICD-10-CM

## 2019-06-11 MED ORDER — AMPHETAMINE-DEXTROAMPHETAMINE 30 MG PO TABS: 30.0000 mg | ORAL_TABLET | Freq: Every day | ORAL | 0 refills | Status: DC

## 2019-06-11 MED ORDER — AMPHETAMINE-DEXTROAMPHET ER 30 MG PO CP24: 30.0000 mg | ORAL_CAPSULE | ORAL | 0 refills | Status: DC

## 2019-06-11 MED ORDER — CELECOXIB 200 MG PO CAPS: ORAL_CAPSULE | ORAL | 2 refills | Status: DC

## 2019-06-11 MED ORDER — AMPHETAMINE-DEXTROAMPHET ER 30 MG PO CP24: 30.0000 mg | ORAL_CAPSULE | Freq: Every day | ORAL | 0 refills | Status: DC

## 2019-06-11 NOTE — Telephone Encounter (Signed)
Appointment has been made. No further questions at this time.  

## 2019-06-11 NOTE — Progress Notes (Signed)
Needs refills on Celebrex and Adderall

## 2019-06-11 NOTE — Progress Notes (Signed)
Patient ID: Jacob Cohen, male   DOB: Mar 19, 1971, 49 y.o.   MRN: [id number] .Marland KitchenVirtual Visit via Video Note  I connected with [name] on 06/11/19 at  3:20 PM EST by a video enabled telemedicine application and verified that I am speaking with the correct person using two identifiers.  Location: Patient: home Provider: clinic   I discussed the limitations of evaluation and management by telemedicine and the availability of in person appointments. The patient expressed understanding and agreed to proceed.  History of Present Illness: Patient is a 49 year old male with ADHD and primary osteoarthritis of right knee who calls into the clinic for 88-month medication refill.  Patient is doing great on his current regimen.  He has no concerns or complaints.  He denies any problems sleeping, palpitations, anxiety, headaches.  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  Patient does request a refill of Celebrex.  He is using this as needed for his osteoarthritis of his knee and other joint pain. He is working with an Doctor, general practice and considering knee replacement in the near future.  He remains as active as he can be but his knee is causing him considerable discomfort.  .. Active Ambulatory Problems    Diagnosis Date Noted  . Lipoma of torso 07/06/2016  . ADHD (attention deficit hyperactivity disorder), combined type 07/06/2016  . Palpitations 07/06/2016  . History of PSVT (paroxysmal supraventricular tachycardia) 07/06/2016  . Heart disease   . Tinea versicolor 12/08/2017  . 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 Polyarthralgia 07/02/2018  . Left shoulder pain 07/02/2018  . Blunt trauma of hip, right, initial encounter 01/30/2019  . 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 Weakness 02/04/2019   Resolved Ambulatory Problems    Diagnosis Date Noted  . No Resolved Ambulatory Problems   Past Medical History:  Diagnosis Date  . ADHD   .  Paroxysmal SVT (supraventricular tachycardia) ([place])   . Syncope    Reviewed med, allergy, problem list.      Observations/Objective: No acute distress. Normal mood and appearance.   .. Today's Vitals   06/11/19 1509  BP: 117/72  Temp: 97.6 F (36.4 C)  TempSrc: Oral  Weight: 181 lb (82.1 kg)  Height: 6' (1.829 m)   Body mass index is 24.55 kg/m.    Assessment and Plan: Marland KitchenMarland KitchenDiagnoses and all orders for this visit:  ADHD (attention deficit hyperactivity disorder), combined type -     amphetamine-dextroamphetamine (ADDERALL) 30 MG tablet; Take 1 tablet by mouth daily. At 2pm. -     amphetamine-dextroamphetamine (ADDERALL) 30 MG tablet; Take 1 tablet by mouth daily. At 2pm -     amphetamine-dextroamphetamine (ADDERALL) 30 MG tablet; Take 1 tablet by mouth daily. At 2pm -     amphetamine-dextroamphetamine (ADDERALL XR) 30 MG 24 hr capsule; Take 1 capsule (30 mg total) by mouth every morning. -     amphetamine-dextroamphetamine (ADDERALL XR) 30 MG 24 hr capsule; Take 1 capsule (30 mg total) by mouth daily. -     amphetamine-dextroamphetamine (ADDERALL XR) 30 MG 24 hr capsule; Take 1 capsule (30 mg total) by mouth every morning.  Polyarthralgia -     celecoxib (CELEBREX) 200 MG capsule; 1-2 tablets daily PRN pain   Refilled Adderall for 3 months.  Refill Celebrex.  Continue management with orthopedic surgeon.   Follow Up Instructions:    I discussed the assessment and treatment plan with the patient. The patient  was provided an opportunity to ask questions and all were answered. The patient agreed with the plan and demonstrated an understanding of the instructions.   The patient was advised to call back or seek an in-person evaluation if the symptoms worsen or if the condition fails to improve as anticipated.    Iran Planas, PA-C

## 2019-07-08 ENCOUNTER — Encounter: Payer: Self-pay | Admitting: Physician Assistant

## 2019-07-08 ENCOUNTER — Telehealth (INDEPENDENT_AMBULATORY_CARE_PROVIDER_SITE_OTHER): Payer: No Typology Code available for payment source | Admitting: Physician Assistant

## 2019-07-08 VITALS — Temp 98.2°F | Ht 72.0 in | Wt 181.0 lb

## 2019-07-08 DIAGNOSIS — R05 Cough: Secondary | ICD-10-CM | POA: Diagnosis not present

## 2019-07-08 DIAGNOSIS — J0100 Acute maxillary sinusitis, unspecified: Secondary | ICD-10-CM | POA: Diagnosis not present

## 2019-07-08 DIAGNOSIS — R062 Wheezing: Secondary | ICD-10-CM | POA: Diagnosis not present

## 2019-07-08 DIAGNOSIS — R059 Cough, unspecified: Secondary | ICD-10-CM

## 2019-07-08 MED ORDER — PREDNISONE 50 MG PO TABS: ORAL_TABLET | ORAL | 0 refills | Status: DC

## 2019-07-08 MED ORDER — AMOXICILLIN-POT CLAVULANATE 875-125 MG PO TABS: 1.0000 | ORAL_TABLET | Freq: Two times a day (BID) | ORAL | 0 refills | Status: DC

## 2019-07-08 NOTE — Progress Notes (Signed)
Patient ID: Jacob Cohen, male   DOB: 1970/11/04, 49 y.o.   MRN: [id number] .Marland KitchenVirtual Visit via Video Note  I connected with [name] on 07/08/19 at  8:50 AM EST by a video enabled telemedicine application and verified that I am speaking with the correct person using two identifiers.  Location: Patient: home Provider: clinic   I discussed the limitations of evaluation and management by telemedicine and the availability of in person appointments. The patient expressed understanding and agreed to proceed.  History of Present Illness: Pt is a 49 yo male who calls into the clinic with sinus pressure, congestion, drainage for last week. He is blowing out yellow and brown sputum. No fever, chills, body aches. Denies any loss of smell or taste. No GI symptoms. He tried tylenol  Cold and sinus and flonase. No improvement in symptoms. He is coughing a lot more and noticed wheezing. He has never had asthma until he had covid last august. Since he has had lots of SOB and wheezing intermittently. Symbicort does help significantly.  .. Active Ambulatory Problems    Diagnosis Date Noted  . Lipoma of torso 07/06/2016  . ADHD (attention deficit hyperactivity disorder), combined type 07/06/2016  . Palpitations 07/06/2016  . History of PSVT (paroxysmal supraventricular tachycardia) 07/06/2016  . Heart disease   . Tinea versicolor 12/08/2017  . 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 Polyarthralgia 07/02/2018  . Left shoulder pain 07/02/2018  . Blunt trauma of hip, right, initial encounter 01/30/2019  . 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 Weakness 02/04/2019   Resolved Ambulatory Problems    Diagnosis Date Noted  . No Resolved Ambulatory Problems   Past Medical History:  Diagnosis Date  . ADHD   . Paroxysmal SVT (supraventricular tachycardia) ([place])   . Syncope    Reviewed med, allergy, problem list.     Observations/Objective: No acute distress.  Normal mood.   Dry cough.  No wheezing or labored breathing.    Assessment and Plan: Marland KitchenMarland KitchenLaterrian was seen today for sinus problem.  Diagnoses and all orders for this visit:  Acute non-recurrent maxillary sinusitis -     amoxicillin-clavulanate (AUGMENTIN) 875-125 MG tablet; Take 1 tablet by mouth 2 (two) times daily. -     predniSONE (DELTASONE) 50 MG tablet; One tab PO daily for 5 days.  Wheezing -     predniSONE (DELTASONE) 50 MG tablet; One tab PO daily for 5 days.  Cough -     predniSONE (DELTASONE) 50 MG tablet; One tab PO daily for 5 days.   Failed conservative therapy. Sent abx/prednisone. Rest and hydrate. Albuterol as needed. No covid symptoms.    Follow Up Instructions:    I discussed the assessment and treatment plan with the patient. The patient was provided an opportunity to ask questions and all were answered. The patient agreed with the plan and demonstrated an understanding of the instructions.   The patient was advised to call back or seek an in-person evaluation if the symptoms worsen or if the condition fails to improve as anticipated.  I provided 7 minutes of non-face-to-face time during this encounter.   Iran Planas, PA-C

## 2019-07-08 NOTE — Progress Notes (Signed)
Possible sinus infection 3 day history: Sinus pressure Runny nose Headache Cough (has had since Covid - in June)  No sore throat/chills/fever/GI symptoms

## 2019-07-15 ENCOUNTER — Other Ambulatory Visit: Payer: Self-pay | Admitting: Physician Assistant

## 2019-07-15 DIAGNOSIS — F902 Attention-deficit hyperactivity disorder, combined type: Secondary | ICD-10-CM

## 2019-07-15 NOTE — Telephone Encounter (Signed)
1/5 I sent 3 months is it not at the pharmacy?

## 2019-07-16 NOTE — Telephone Encounter (Signed)
This is at the pharmacy. Can you deny refill?

## 2019-08-11 ENCOUNTER — Other Ambulatory Visit: Payer: Self-pay | Admitting: Physician Assistant

## 2019-08-11 DIAGNOSIS — F902 Attention-deficit hyperactivity disorder, combined type: Secondary | ICD-10-CM

## 2019-08-12 ENCOUNTER — Other Ambulatory Visit: Payer: Self-pay | Admitting: Physician Assistant

## 2019-08-12 DIAGNOSIS — F902 Attention-deficit hyperactivity disorder, combined type: Secondary | ICD-10-CM

## 2019-08-12 MED ORDER — AMPHETAMINE-DEXTROAMPHETAMINE 30 MG PO TABS: 30.0000 mg | ORAL_TABLET | Freq: Every day | ORAL | 0 refills | Status: DC

## 2019-08-12 NOTE — Telephone Encounter (Signed)
Duplicate med refill request for amphetamine-dextro. Unable to decline request - no authorization.

## 2019-08-12 NOTE — Telephone Encounter (Signed)
Duplicate med refill request for amphetamine-dextro. Unable to refuse request - no authorization.

## 2019-08-22 ENCOUNTER — Other Ambulatory Visit: Payer: Self-pay | Admitting: Physician Assistant

## 2019-08-26 ENCOUNTER — Other Ambulatory Visit: Payer: Self-pay | Admitting: Physician Assistant

## 2019-08-26 DIAGNOSIS — F902 Attention-deficit hyperactivity disorder, combined type: Secondary | ICD-10-CM

## 2019-08-26 MED ORDER — AMPHETAMINE-DEXTROAMPHET ER 30 MG PO CP24: 30.0000 mg | ORAL_CAPSULE | ORAL | 0 refills | Status: DC

## 2019-08-28 ENCOUNTER — Encounter: Payer: Self-pay | Admitting: Physician Assistant

## 2019-09-04 ENCOUNTER — Encounter: Payer: Self-pay | Admitting: Family Medicine

## 2019-09-04 ENCOUNTER — Telehealth (INDEPENDENT_AMBULATORY_CARE_PROVIDER_SITE_OTHER): Payer: No Typology Code available for payment source | Admitting: Family Medicine

## 2019-09-04 DIAGNOSIS — Z8616 Personal history of COVID-19: Secondary | ICD-10-CM | POA: Diagnosis not present

## 2019-09-04 DIAGNOSIS — R05 Cough: Secondary | ICD-10-CM | POA: Diagnosis not present

## 2019-09-04 DIAGNOSIS — R059 Cough, unspecified: Secondary | ICD-10-CM

## 2019-09-04 DIAGNOSIS — R053 Chronic cough: Secondary | ICD-10-CM | POA: Insufficient documentation

## 2019-09-04 DIAGNOSIS — U099 Post covid-19 condition, unspecified: Secondary | ICD-10-CM | POA: Insufficient documentation

## 2019-09-04 MED ORDER — BUDESONIDE-FORMOTEROL FUMARATE 160-4.5 MCG/ACT IN AERO: INHALATION_SPRAY | RESPIRATORY_TRACT | 3 refills | Status: DC

## 2019-09-04 NOTE — Progress Notes (Signed)
Jacob Cohen - 49 y.o. male MRN [id number]  Date of birth: Feb 01, 1971   This visit type was conducted due to national recommendations for restrictions regarding the COVID-19 Pandemic (e.g. social distancing).  This format is felt to be most appropriate for this patient at this time.  All issues noted in this document were discussed and addressed.  No physical exam was performed (except for noted visual exam findings with Video Visits).  I discussed the limitations of evaluation and management by telemedicine and the availability of in person appointments. The patient expressed understanding and agreed to proceed.  I connected with@ on 09/04/19 at  8:10 AM EDT by a video enabled telemedicine application and verified that I am speaking with the correct person using two identifiers.  Present at visit: Luetta Nutting, DO [name]   Patient Location: Home [place] [address]   Provider location:   [facility]  Chief Complaint  Patient presents with  . Shortness of Breath  . Cough    HPI  Jacob Cohen is a 49 y.o. male who presents via audio/video conferencing for a telehealth visit today.  He has complaint of cough.  Cough has been fairly persistent since he had COVID-19 in July/August of last year.  He has had associated wheezing, shortness of breath and had not regained his sense of smell. He is using symbicort daily and albuterol neb as needed.  This is keeping symptoms controlled, however if he misses symbicort symptoms flare up again.  He has been treated with steroids a couple of times as well as azithromycin.  Symptoms improve with steroids but then return.  He denies fever, chills, tightness in chest/chest pain, palpitations or reflux symptoms.    ROS:  A comprehensive ROS was completed and negative except as noted per HPI  Past Medical History:  Diagnosis Date  . ADHD   . Paroxysmal SVT (supraventricular tachycardia) ([facility])    s/p ablation  . Syncope      Past Surgical History:  Procedure Laterality Date  . CARDIAC ELECTROPHYSIOLOGY STUDY AND ABLATION  2012  . HERNIA REPAIR    . KNEE ARTHROSCOPY WITH MEDIAL MENISECTOMY Right 08/09/2018   Procedure: RIGHT KNEE ARTHROSCOPY WITH MEDIAL MENISECTOMY;  Surgeon: Hiram Gash, MD;  Location: [facility];  Service: Orthopedics;  Laterality: Right;  . KNEE SURGERY Left 2000    Family History  Problem Relation Age of Onset  . Hypertension Mother   . Cancer Maternal Grandmother   . Alzheimer's disease Maternal Grandfather   . COPD Paternal Grandfather     Social History   Socioeconomic History  . Marital status: Divorced    Spouse name: Not on file  . Number of children: Not on file  . Years of education: Not on file  . Highest education level: Not on file  Occupational History  . Not on file  Tobacco Use  . Smoking status: Never Smoker  . Smokeless tobacco: Former Network engineer and Sexual Activity  . Alcohol use: Yes    Comment: 2-4 beers on the weekend  . Drug use: No  . Sexual activity: Yes  Other Topics Concern  . Not on file  Social History Narrative  . Not on file   Social Determinants of Health   Financial Resource Strain:   . Difficulty of Paying Living Expenses:   Food Insecurity:   . Worried About Charity fundraiser in the Last Year:   . [employer] of  Food in the Last Year:   Transportation Needs:   . Film/video editor (Medical):   Marland Kitchen Lack of Transportation (Non-Medical):   Physical Activity:   . Days of Exercise per Week:   . Minutes of Exercise per Session:   Stress:   . Feeling of Stress :   Social Connections:   . Frequency of Communication with Friends and Family:   . Frequency of Social Gatherings with Friends and Family:   . Attends Religious Services:   . Active Member of Clubs or Organizations:   . Attends Archivist Meetings:   Marland Kitchen Marital Status:   Intimate Partner Violence:   . Fear of Current or Ex-Partner:   .  Emotionally Abused:   Marland Kitchen Physically Abused:   . Sexually Abused:      Current Outpatient Medications:  .  amphetamine-dextroamphetamine (ADDERALL XR) 30 MG 24 hr capsule, Take 1 capsule (30 mg total) by mouth daily., Disp: 30 capsule, Rfl: 0 .  amphetamine-dextroamphetamine (ADDERALL XR) 30 MG 24 hr capsule, Take 1 capsule (30 mg total) by mouth every morning., Disp: 30 capsule, Rfl: 0 .  amphetamine-dextroamphetamine (ADDERALL XR) 30 MG 24 hr capsule, Take 1 capsule (30 mg total) by mouth every morning., Disp: 30 capsule, Rfl: 0 .  amphetamine-dextroamphetamine (ADDERALL) 30 MG tablet, Take 1 tablet by mouth daily. At 2pm, Disp: 30 tablet, Rfl: 0 .  amphetamine-dextroamphetamine (ADDERALL) 30 MG tablet, Take 1 tablet by mouth daily. At 2pm, Disp: 30 tablet, Rfl: 0 .  amphetamine-dextroamphetamine (ADDERALL) 30 MG tablet, Take 1 tablet by mouth daily. At 2pm., Disp: 30 tablet, Rfl: 0 .  budesonide-formoterol (SYMBICORT) 160-4.5 MCG/ACT inhaler, TAKE 2 PUFFS BY MOUTH TWICE A DAY, Disp: 10.2 Inhaler, Rfl: 3 .  celecoxib (CELEBREX) 200 MG capsule, 1-2 tablets daily PRN pain, Disp: 60 capsule, Rfl: 2 .  metoprolol succinate (TOPROL-XL) 25 MG 24 hr tablet, Take 1 tablet (25 mg total) by mouth daily., Disp: 90 tablet, Rfl: 4  EXAM:  VITALS per patient if applicable: There were no vitals taken for this visit.  GENERAL: alert, oriented, appears well and in no acute distress  HEENT: atraumatic, conjunttiva clear, no obvious abnormalities on inspection of external nose and ears  NECK: normal movements of the head and neck  LUNGS: on inspection no signs of respiratory distress, breathing rate appears normal, no obvious gross SOB, gasping or wheezing  CV: no obvious cyanosis  MS: moves all visible extremities without noticeable abnormality  PSYCH/NEURO: pleasant and cooperative, no obvious depression or anxiety, speech and thought processing grossly intact  ASSESSMENT AND PLAN:  Discussed the  following assessment and plan:  Cough Persistent cough and shortness of breath after COVID last summer.  CXR normal in 02/2019, will repeat given persistence of symptoms.  Intermittent improvement with steroids previously but symptoms never fully resolve.  Maintaining with symbicort daily and albuterol as needed, recommend continuation.  Referral entered to pulmonology as well.    Meds ordered this encounter  Medications  . budesonide-formoterol (SYMBICORT) 160-4.5 MCG/ACT inhaler    Sig: TAKE 2 PUFFS BY MOUTH TWICE A DAY    Dispense:  10.2 Inhaler    Refill:  3   30 minutes spent including pre visit preparation, review of prior notes and labs, encounter with patient via video visit and same day documentation.     I discussed the assessment and treatment plan with the patient. The patient was provided an opportunity to ask questions and all were answered. The patient agreed  with the plan and demonstrated an understanding of the instructions.   The patient was advised to call back or seek an in-person evaluation if the symptoms worsen or if the condition fails to improve as anticipated.    Luetta Nutting, DO

## 2019-09-04 NOTE — Assessment & Plan Note (Addendum)
Persistent cough and shortness of breath after COVID last summer.  CXR normal in 02/2019, will repeat given persistence of symptoms.  Intermittent improvement with steroids previously but symptoms never fully resolve.  Maintaining with symbicort daily and albuterol as needed, recommend continuation.  Referral entered to pulmonology as well.

## 2019-09-04 NOTE — Progress Notes (Signed)
Patient reports shortness of breath, and cough that is not productive some days and then productive some days.   He has been using his nebulizer that seems to be the only one to help.  Nothing else is helping.

## 2019-09-09 ENCOUNTER — Ambulatory Visit (INDEPENDENT_AMBULATORY_CARE_PROVIDER_SITE_OTHER): Payer: No Typology Code available for payment source

## 2019-09-09 ENCOUNTER — Other Ambulatory Visit: Payer: Self-pay | Admitting: Physician Assistant

## 2019-09-09 ENCOUNTER — Other Ambulatory Visit: Payer: Self-pay

## 2019-09-09 DIAGNOSIS — R05 Cough: Secondary | ICD-10-CM | POA: Diagnosis not present

## 2019-09-09 DIAGNOSIS — Z8616 Personal history of COVID-19: Secondary | ICD-10-CM | POA: Diagnosis not present

## 2019-09-09 DIAGNOSIS — F902 Attention-deficit hyperactivity disorder, combined type: Secondary | ICD-10-CM

## 2019-09-09 DIAGNOSIS — R059 Cough, unspecified: Secondary | ICD-10-CM

## 2019-09-09 MED ORDER — AMPHETAMINE-DEXTROAMPHETAMINE 30 MG PO TABS: 30.0000 mg | ORAL_TABLET | Freq: Every day | ORAL | 0 refills | Status: DC

## 2019-09-09 NOTE — Telephone Encounter (Signed)
[facility] requesting med refill for adderall 30 mg tab.

## 2019-09-24 ENCOUNTER — Telehealth: Payer: Self-pay | Admitting: Physician Assistant

## 2019-09-24 DIAGNOSIS — F902 Attention-deficit hyperactivity disorder, combined type: Secondary | ICD-10-CM

## 2019-09-24 NOTE — Telephone Encounter (Signed)
Patient sent request for Adderall, please call and let him know he needs appt before further refills

## 2019-09-25 NOTE — Telephone Encounter (Signed)
Left VM for PT to scheduled med follow up.

## 2019-09-26 ENCOUNTER — Ambulatory Visit: Payer: No Typology Code available for payment source | Admitting: Nurse Practitioner

## 2019-10-31 ENCOUNTER — Encounter: Payer: Self-pay | Admitting: Nurse Practitioner

## 2019-10-31 ENCOUNTER — Ambulatory Visit (INDEPENDENT_AMBULATORY_CARE_PROVIDER_SITE_OTHER): Payer: 59 | Admitting: Nurse Practitioner

## 2019-10-31 VITALS — BP 164/94 | HR 97 | Temp 98.2°F | Ht 72.0 in | Wt 181.6 lb

## 2019-10-31 DIAGNOSIS — F902 Attention-deficit hyperactivity disorder, combined type: Secondary | ICD-10-CM

## 2019-10-31 MED ORDER — AMPHETAMINE-DEXTROAMPHETAMINE 30 MG PO TABS: 30.0000 mg | ORAL_TABLET | Freq: Every day | ORAL | 0 refills | Status: DC

## 2019-10-31 MED ORDER — AMPHETAMINE-DEXTROAMPHET ER 30 MG PO CP24: 30.0000 mg | ORAL_CAPSULE | ORAL | 0 refills | Status: DC

## 2019-10-31 MED ORDER — AMPHETAMINE-DEXTROAMPHET ER 30 MG PO CP24: 30.0000 mg | ORAL_CAPSULE | Freq: Every day | ORAL | 0 refills | Status: DC

## 2019-10-31 NOTE — Progress Notes (Signed)
Established Patient Office Visit  Subjective:  Patient ID: Jacob Cohen, male    DOB: [birth year]  Age: 49 y.o. MRN: [id number]  CC: ADHD follow-up.  HPI Jacob Cohen presents for ADHD follow-up and medication management.  Patient reports he is well controlled on his current medication and dosages.  He does take the Adderall XR every morning and between 2 and 3 PM he takes the nonextended release Adderall.  He feels this medication helps with his focus, attention, and work performance.  ADHD FOLLOW UP ADHD status: controlled Satisfied with current therapy: yes Medication compliance:  excellent compliance Taking meds on weekends/vacations: yes Work/school performance:  good Difficulty sustaining attention/completing tasks: no Distracted by extraneous stimuli: no Does not listen when spoken to: no  Fidgets with hands or feet: no Unable to stay in seat: no Blurts out/interrupts others: no ADHD Medication Side Effects: no    Decreased appetite: yes    Headache: no    Sleeping disturbance pattern: no    Irritability: no    Rebound effects (worse than baseline) off medication: no    Anxiousness: no    Dizziness: no    Tics: no   Past Medical History:  Diagnosis Date  . ADHD   . Paroxysmal SVT (supraventricular tachycardia) ([facility])    s/p ablation  . Syncope     Past Surgical History:  Procedure Laterality Date  . CARDIAC ELECTROPHYSIOLOGY STUDY AND ABLATION  2012  . HERNIA REPAIR    . KNEE ARTHROSCOPY WITH MEDIAL MENISECTOMY Right 08/09/2018   Procedure: RIGHT KNEE ARTHROSCOPY WITH MEDIAL MENISECTOMY;  Surgeon: Hiram Gash, MD;  Location: [facility];  Service: Orthopedics;  Laterality: Right;  . KNEE SURGERY Left 2000    Family History  Problem Relation Age of Onset  . Hypertension Mother   . Cancer Maternal Grandmother   . Alzheimer's disease Maternal Grandfather   . COPD Paternal Grandfather     Social History   Socioeconomic History  . Marital  status: Divorced    Spouse name: Not on file  . Number of children: Not on file  . Years of education: Not on file  . Highest education level: Not on file  Occupational History  . Not on file  Tobacco Use  . Smoking status: Never Smoker  . Smokeless tobacco: Former Network engineer and Sexual Activity  . Alcohol use: Yes    Comment: 2-4 beers on the weekend  . Drug use: No  . Sexual activity: Yes  Other Topics Concern  . Not on file  Social History Narrative  . Not on file   Social Determinants of Health   Financial Resource Strain:   . Difficulty of Paying Living Expenses:   Food Insecurity:   . Worried About Charity fundraiser in the Last Year:   . Arboriculturist in the Last Year:   Transportation Needs:   . Film/video editor (Medical):   Marland Kitchen Lack of Transportation (Non-Medical):   Physical Activity:   . Days of Exercise per Week:   . Minutes of Exercise per Session:   Stress:   . Feeling of Stress :   Social Connections:   . Frequency of Communication with Friends and Family:   . Frequency of Social Gatherings with Friends and Family:   . Attends Religious Services:   . Active Member of Clubs or Organizations:   . Attends Archivist Meetings:   Marland Kitchen Marital Status:   Intimate Partner Violence:   .  Fear of Current or Ex-Partner:   . Emotionally Abused:   Marland Kitchen Physically Abused:   . Sexually Abused:     Outpatient Medications Prior to Visit  Medication Sig Dispense Refill  . amphetamine-dextroamphetamine (ADDERALL XR) 30 MG 24 hr capsule Take 1 capsule (30 mg total) by mouth daily. 30 capsule 0  . amphetamine-dextroamphetamine (ADDERALL XR) 30 MG 24 hr capsule Take 1 capsule (30 mg total) by mouth every morning. 30 capsule 0  . amphetamine-dextroamphetamine (ADDERALL XR) 30 MG 24 hr capsule Take 1 capsule (30 mg total) by mouth every morning. 30 capsule 0  . amphetamine-dextroamphetamine (ADDERALL) 30 MG tablet Take 1 tablet by mouth daily. At 2pm 30  tablet 0  . amphetamine-dextroamphetamine (ADDERALL) 30 MG tablet Take 1 tablet by mouth daily. At 2pm 30 tablet 0  . amphetamine-dextroamphetamine (ADDERALL) 30 MG tablet Take 1 tablet by mouth daily. At 2pm. 30 tablet 0  . budesonide-formoterol (SYMBICORT) 160-4.5 MCG/ACT inhaler TAKE 2 PUFFS BY MOUTH TWICE A DAY 10.2 Inhaler 3  . celecoxib (CELEBREX) 200 MG capsule 1-2 tablets daily PRN pain 60 capsule 2  . metoprolol succinate (TOPROL-XL) 25 MG 24 hr tablet Take 1 tablet (25 mg total) by mouth daily. 90 tablet 4   No facility-administered medications prior to visit.    No Known Allergies  ROS Review of Systems  Constitutional: Negative for activity change, appetite change, fatigue and unexpected weight change.  Respiratory: Negative for cough, chest tightness and shortness of breath.   Cardiovascular: Negative for chest pain, palpitations and leg swelling.  Gastrointestinal: Negative for constipation, diarrhea, nausea and vomiting.  Neurological: Negative for dizziness, tremors, weakness, light-headedness and headaches.  Psychiatric/Behavioral: Negative for agitation, behavioral problems, confusion and sleep disturbance. The patient is not nervous/anxious and is not hyperactive.       Objective:    Physical Exam  Constitutional: He is oriented to person, place, and time. He appears well-developed and well-nourished.  HENT:  Head: Normocephalic.  Eyes: Pupils are equal, round, and reactive to light. Conjunctivae and EOM are normal.  Cardiovascular: Normal rate, regular rhythm and normal heart sounds.  Pulmonary/Chest: Effort normal and breath sounds normal.  Abdominal: Soft.  Musculoskeletal:        General: Normal range of motion.     Cervical back: Normal range of motion.  Neurological: He is alert and oriented to person, place, and time.  Skin: Skin is warm and dry.  Psychiatric: He has a normal mood and affect. His behavior is normal. Judgment and thought content normal.   Nursing note and vitals reviewed.   There were no vitals taken for this visit. Wt Readings from Last 3 Encounters:  07/08/19 181 lb (82.1 kg)  06/11/19 181 lb (82.1 kg)  02/26/19 181 lb (82.1 kg)     Health Maintenance Due  Topic Date Due  . HIV Screening  Never done    There are no preventive care reminders to display for this patient.  Lab Results  Component Value Date   TSH 1.320 09/13/2017   Lab Results  Component Value Date   WBC 6.8 02/26/2019   HGB 15.0 02/26/2019   HCT 43.9 02/26/2019   MCV 97.1 02/26/2019   PLT 212 02/26/2019   Lab Results  Component Value Date   NA 141 02/26/2019   K 3.9 02/26/2019   CO2 27 02/26/2019   GLUCOSE 130 (H) 02/26/2019   BUN 11 02/26/2019   CREATININE 0.98 02/26/2019   BILITOT 0.6 02/26/2019   ALKPHOS 50  08/31/2016   AST 12 02/26/2019   ALT 13 02/26/2019   PROT 6.8 02/26/2019   ALBUMIN 4.4 08/31/2016   CALCIUM 9.1 02/26/2019   Lab Results  Component Value Date   CHOL 222 (H) 05/23/2018   Lab Results  Component Value Date   HDL 48 05/23/2018   Lab Results  Component Value Date   LDLCALC 131 (H) 05/23/2018   Lab Results  Component Value Date   TRIG 278 (H) 05/23/2018   Lab Results  Component Value Date   CHOLHDL 4.6 05/23/2018   No results found for: HGBA1C    Assessment & Plan:  1. ADHD (attention deficit hyperactivity disorder), combined type ADHD well controlled with current medication regimen of Adderall XR 30 mg every morning and Adderall nonextended release version 30 mg between 2 and 3 in the afternoon. No concerns today. PLAN: -No changes to medication regimen. -Refills sent to pharmacy.  -Follow-up with PCP in 3 months for medication management.  - amphetamine-dextroamphetamine (ADDERALL XR) 30 MG 24 hr capsule; Take 1 capsule (30 mg total) by mouth daily. Script 3/3. Schedule appointment for refills.  Dispense: 30 capsule; Refill: 0 - amphetamine-dextroamphetamine (ADDERALL XR) 30 MG 24 hr  capsule; Take 1 capsule (30 mg total) by mouth every morning. Script 2/3  Dispense: 30 capsule; Refill: 0 - amphetamine-dextroamphetamine (ADDERALL XR) 30 MG 24 hr capsule; Take 1 capsule (30 mg total) by mouth every morning. Script 1/3  Dispense: 30 capsule; Refill: 0 - amphetamine-dextroamphetamine (ADDERALL) 30 MG tablet; Take 1 tablet by mouth daily. At 2pm. Script 3/3 - Schedule appointment for refills.  Dispense: 30 tablet; Refill: 0 - amphetamine-dextroamphetamine (ADDERALL) 30 MG tablet; Take 1 tablet by mouth daily. At 2pm. Script 2/3  Dispense: 30 tablet; Refill: 0 - amphetamine-dextroamphetamine (ADDERALL) 30 MG tablet; Take 1 tablet by mouth daily. At 2pm. Script 1/3  Dispense: 30 tablet; Refill: 0   Orma Render, NP

## 2019-10-31 NOTE — Patient Instructions (Signed)
Plan to follow-up in 3 months with in person or virtual visit for medication management for ADHD.

## 2019-12-30 ENCOUNTER — Ambulatory Visit (INDEPENDENT_AMBULATORY_CARE_PROVIDER_SITE_OTHER): Payer: 59

## 2019-12-30 ENCOUNTER — Other Ambulatory Visit: Payer: Self-pay

## 2019-12-30 ENCOUNTER — Ambulatory Visit (INDEPENDENT_AMBULATORY_CARE_PROVIDER_SITE_OTHER): Payer: 59 | Admitting: Sports Medicine

## 2019-12-30 ENCOUNTER — Telehealth: Payer: Self-pay | Admitting: Sports Medicine

## 2019-12-30 ENCOUNTER — Encounter: Payer: Self-pay | Admitting: Sports Medicine

## 2019-12-30 DIAGNOSIS — M533 Sacrococcygeal disorders, not elsewhere classified: Secondary | ICD-10-CM | POA: Diagnosis not present

## 2019-12-30 DIAGNOSIS — G8929 Other chronic pain: Secondary | ICD-10-CM

## 2019-12-30 DIAGNOSIS — M7121 Synovial cyst of popliteal space [Baker], right knee: Secondary | ICD-10-CM

## 2019-12-30 MED ORDER — MELOXICAM 15 MG PO TABS: ORAL_TABLET | ORAL | 3 refills | Status: AC

## 2019-12-30 NOTE — Progress Notes (Signed)
    Procedures performed today:    Procedure: Real-time Ultrasound Guided injection of the right sacroiliac joint Device: Samsung HS60  Verbal informed consent obtained.  Time-out conducted.  Noted no overlying erythema, induration, or other signs of local infection.  Skin prepped in a sterile fashion.  Local anesthesia: Topical Ethyl chloride.  With sterile technique and under real time ultrasound guidance: 1 cc Kenalog 40, 2 cc lidocaine, 2 cc bupivacaine injected easily Completed without difficulty  Pain immediately resolved suggesting accurate placement of the medication.  Advised to call if fevers/chills, erythema, induration, drainage, or persistent bleeding.  Images permanently stored and available for review in the ultrasound unit.  Impression: Technically successful ultrasound guided injection.  Independent interpretation of notes and tests performed by another provider:   None.  Brief History, Exam, Impression, and Recommendations:    Chronic right SI joint pain This is a pleasant 49 year old male, for a year now he is continued to have pain in his right low back, localized directly over the sacroiliac joint, after a fall. Today we injected his sacroiliac joint for diagnostic and therapeutic purposes and he had immediate and dramatic relief of his pain. I am going to give him some SI joint rehab exercises, dedicated SI joint x-rays, return to see me in 1 month.  Primary osteoarthritis of right knee with Baker's cyst Pleas Patricia continues to have right knee pain, he is post partial meniscectomy, due to persistent pain and likely osteoarthritis we will get updated x-rays and get him set up for viscosupplementation.    ___________________________________________ Gwen Her. Dianah Field, M.D., ABFM., CAQSM. Primary Care and [place] Instructor of [place] of [facility] of Medicine

## 2019-12-30 NOTE — Assessment & Plan Note (Addendum)
This is a pleasant 49 year old male, for a year now he is continued to have pain in his right low back, localized directly over the sacroiliac joint, after a fall. Today we injected his sacroiliac joint for diagnostic and therapeutic purposes and he had immediate and dramatic relief of his pain. I am going to give him some SI joint rehab exercises, dedicated SI joint x-rays, return to see me in 1 month.

## 2019-12-30 NOTE — Progress Notes (Addendum)
    Procedures performed today:    None.  Independent interpretation of notes and tests performed by another provider:   None.  Brief History, Exam, Impression, and Recommendations:    Jacob Cohen is a pleasant 49yo male who comes in with complaints of right hip pain that began a year ago after a fall and has not improved since. He has tried NSAIDs and PT without improvement. He describes the pain as worse when bending at the lower back. An xray after the fal 1 year ago showed no fracture. He has no numbness, weakness, or tingling in his legs. There is pain upon palpation of the right SI joint. Injection of the SI joint today provided immediate relief.   Marcelino Duster, MS3   ___________________________________________ Gwen Her. Dianah Field, M.D., ABFM., CAQSM. Primary Care and [place] Instructor of [place] of [facility] of Medicine

## 2019-12-30 NOTE — Assessment & Plan Note (Signed)
[name] continues to have right knee pain, he is post partial meniscectomy, due to persistent pain and likely osteoarthritis we will get updated x-rays and get him set up for viscosupplementation.

## 2019-12-30 NOTE — Telephone Encounter (Signed)
Orthovisc approval please, right knee, please schedule patient when approved, he has failed steroid injections, NSAIDs, activity modification, x-ray confirmed, he is also failed arthroscopy.

## 2019-12-31 ENCOUNTER — Other Ambulatory Visit: Payer: Self-pay | Admitting: Nurse Practitioner

## 2019-12-31 DIAGNOSIS — F902 Attention-deficit hyperactivity disorder, combined type: Secondary | ICD-10-CM

## 2019-12-31 NOTE — Telephone Encounter (Signed)
Routing to covering provider. Rxs request completed on 12/30/19. Unable to decline duplicate request - restricted access.

## 2019-12-31 NOTE — Telephone Encounter (Signed)
Information has been sent to [facility] and waiting on a response.

## 2020-01-02 NOTE — Telephone Encounter (Signed)
Received a fax from insurance requesting additional information. I have submitted this to insurance and I am waiting on a response.

## 2020-01-09 NOTE — Telephone Encounter (Signed)
Yahoooooooooooooooooo

## 2020-01-09 NOTE — Telephone Encounter (Addendum)
Received fax from [facility] they have approved Orthovisc from 01/08/20 - 01/06/21-. I will have Janett Billow schedule patient - CF

## 2020-01-09 NOTE — Telephone Encounter (Signed)
Dr. Darene Lamer    this patient is buy and bill getting Jess to schedule now. - CF

## 2020-01-21 ENCOUNTER — Other Ambulatory Visit: Payer: Self-pay

## 2020-01-21 ENCOUNTER — Ambulatory Visit (INDEPENDENT_AMBULATORY_CARE_PROVIDER_SITE_OTHER): Payer: 59 | Admitting: Sports Medicine

## 2020-01-21 DIAGNOSIS — M7121 Synovial cyst of popliteal space [Baker], right knee: Secondary | ICD-10-CM | POA: Diagnosis not present

## 2020-01-21 NOTE — Progress Notes (Signed)
    Procedures performed today:    Procedure: Real-time Ultrasound Guided injection of the right knee Device: Samsung HS60  Verbal informed consent obtained.  Time-out conducted.  Noted no overlying erythema, induration, or other signs of local infection.  Skin prepped in a sterile fashion.  Local anesthesia: Topical Ethyl chloride.  With sterile technique and under real time ultrasound guidance:  30 mg/2 mL of OrthoVisc (sodium hyaluronate) in a prefilled syringe was injected easily into the knee through a 22-gauge needle.  Completed without difficulty  Pain immediately resolved suggesting accurate placement of the medication.  Advised to call if fevers/chills, erythema, induration, drainage, or persistent bleeding.  Images permanently stored and available for review in the ultrasound unit.  Impression: Technically successful ultrasound guided injection.  Independent interpretation of notes and tests performed by another provider:   None.  Brief History, Exam, Impression, and Recommendations:    Primary osteoarthritis of right knee with Baker's cyst Orthovisc No. 1 of 4 to the right knee, return in 1 week #2 of 4.    ___________________________________________ Gwen Her. Dianah Field, M.D., ABFM., CAQSM. Primary Care and [place] Instructor of [place] of [facility] of Medicine

## 2020-01-21 NOTE — Assessment & Plan Note (Signed)
Orthovisc No. 1 of 4 to the right knee, return in 1 week #2 of 4.

## 2020-01-28 ENCOUNTER — Ambulatory Visit (INDEPENDENT_AMBULATORY_CARE_PROVIDER_SITE_OTHER): Payer: 59

## 2020-01-28 ENCOUNTER — Ambulatory Visit (INDEPENDENT_AMBULATORY_CARE_PROVIDER_SITE_OTHER): Payer: 59 | Admitting: Sports Medicine

## 2020-01-28 DIAGNOSIS — M7121 Synovial cyst of popliteal space [Baker], right knee: Secondary | ICD-10-CM

## 2020-01-28 NOTE — Progress Notes (Signed)
    Procedures performed today:    Procedure: Real-time Ultrasound Guidedinjection of the right knee Device: Samsung HS60  Verbal informed consent obtained.  Time-out conducted.  Noted no overlying erythema, induration, or other signs of local infection.  Skin prepped in a sterile fashion.  Local anesthesia: Topical Ethyl chloride.  With sterile technique and under real time ultrasound guidance: 30 mg/2 mL of OrthoVisc (sodium hyaluronate) in a prefilled syringe was injected easily into the knee through a 22-gauge needle.  Completed without difficulty  Pain immediately resolved suggesting accurate placement of the medication.  Advised to call if fevers/chills, erythema, induration, drainage, or persistent bleeding.  Images permanently stored and available for review in the ultrasound unit.  Impression: Technically successful ultrasound guided injection.  Independent interpretation of notes and tests performed by another provider:   None.  Brief History, Exam, Impression, and Recommendations:    Primary osteoarthritis of right knee with Baker's cyst Orthovisc No. 2 of 4 into the right knee, return in 1 week for #3 of 4.    ___________________________________________ Gwen Her. Dianah Field, M.D., ABFM., CAQSM. Primary Care and [place] Instructor of [place] of [facility] of Medicine

## 2020-01-28 NOTE — Assessment & Plan Note (Signed)
Orthovisc No. 2 of 4 into the right knee, return in 1 week for #3 of 4. 

## 2020-02-04 ENCOUNTER — Ambulatory Visit: Payer: 59 | Admitting: Sports Medicine

## 2020-02-05 ENCOUNTER — Other Ambulatory Visit: Payer: Self-pay | Admitting: Nurse Practitioner

## 2020-02-05 DIAGNOSIS — F902 Attention-deficit hyperactivity disorder, combined type: Secondary | ICD-10-CM

## 2020-02-06 ENCOUNTER — Other Ambulatory Visit: Payer: Self-pay | Admitting: Physician Assistant

## 2020-02-06 ENCOUNTER — Ambulatory Visit: Payer: 59 | Admitting: Sports Medicine

## 2020-02-06 DIAGNOSIS — R002 Palpitations: Secondary | ICD-10-CM

## 2020-02-06 DIAGNOSIS — Z8679 Personal history of other diseases of the circulatory system: Secondary | ICD-10-CM

## 2020-02-07 ENCOUNTER — Other Ambulatory Visit: Payer: Self-pay

## 2020-02-07 ENCOUNTER — Encounter: Payer: Self-pay | Admitting: Nurse Practitioner

## 2020-02-07 ENCOUNTER — Ambulatory Visit (INDEPENDENT_AMBULATORY_CARE_PROVIDER_SITE_OTHER): Payer: 59 | Admitting: Nurse Practitioner

## 2020-02-07 VITALS — BP 131/85 | HR 84 | Ht 72.0 in | Wt 173.0 lb

## 2020-02-07 DIAGNOSIS — R062 Wheezing: Secondary | ICD-10-CM

## 2020-02-07 DIAGNOSIS — F902 Attention-deficit hyperactivity disorder, combined type: Secondary | ICD-10-CM

## 2020-02-07 MED ORDER — AMPHETAMINE-DEXTROAMPHET ER 30 MG PO CP24: 30.0000 mg | ORAL_CAPSULE | Freq: Every day | ORAL | 0 refills | Status: DC

## 2020-02-07 MED ORDER — AMPHETAMINE-DEXTROAMPHET ER 30 MG PO CP24: 30.0000 mg | ORAL_CAPSULE | ORAL | 0 refills | Status: DC

## 2020-02-07 MED ORDER — AMPHETAMINE-DEXTROAMPHETAMINE 30 MG PO TABS: 30.0000 mg | ORAL_TABLET | Freq: Every day | ORAL | 0 refills | Status: DC

## 2020-02-07 MED ORDER — BUDESONIDE-FORMOTEROL FUMARATE 160-4.5 MCG/ACT IN AERO: INHALATION_SPRAY | RESPIRATORY_TRACT | 1 refills | Status: DC

## 2020-02-07 NOTE — Progress Notes (Signed)
Acute Office Visit  Subjective:    Patient ID: Jacob Cohen, male    DOB: Jul 28, 1970, 49 y.o.   MRN: [id number]  Chief Complaint  Patient presents with  . ADHD    HPI Patient is in today for ADHD follow-up.  He is currently taking Adderall 30 mg every morning of the extended release and Adderall 30 mg every afternoon of the immediate release.  He reports this is working very well for him and feels like it helps control his ADHD symptoms quite well.  ADHD FOLLOW UP ADHD status: controlled Satisfied with current therapy: yes Medication compliance:  good compliance Taking meds on weekends/vacations: no Work/school performance:  good Difficulty sustaining attention/completing tasks: no Distracted by extraneous stimuli: no Does not listen when spoken to: no  Fidgets with hands or feet: no Unable to stay in seat: no Blurts out/interrupts others: no ADHD Medication Side Effects: no    Decreased appetite: yes    Headache: no    Sleeping disturbance pattern: no    Irritability: no    Rebound effects (worse than baseline) off medication: no    Anxiousness: no    Dizziness: no    Tics: no    Past Medical History:  Diagnosis Date  . ADHD   . Paroxysmal SVT (supraventricular tachycardia) (HCC)    s/p ablation  . Syncope     Past Surgical History:  Procedure Laterality Date  . CARDIAC ELECTROPHYSIOLOGY STUDY AND ABLATION  2012  . HERNIA REPAIR    . KNEE ARTHROSCOPY WITH MEDIAL MENISECTOMY Right 08/09/2018   Procedure: RIGHT KNEE ARTHROSCOPY WITH MEDIAL MENISECTOMY;  Surgeon: Hiram Gash, MD;  Location: [facility];  Service: Orthopedics;  Laterality: Right;  . KNEE SURGERY Left 2000    Family History  Problem Relation Age of Onset  . Hypertension Mother   . Cancer Maternal Grandmother   . Alzheimer's disease Maternal Grandfather   . COPD Paternal Grandfather     Social History   Socioeconomic History  . Marital status: Divorced    Spouse name: Not on  file  . Number of children: Not on file  . Years of education: Not on file  . Highest education level: Not on file  Occupational History  . Not on file  Tobacco Use  . Smoking status: Never Smoker  . Smokeless tobacco: Former Network engineer  . Vaping Use: Never used  Substance and Sexual Activity  . Alcohol use: Yes    Comment: 2-4 beers on the weekend  . Drug use: No  . Sexual activity: Yes  Other Topics Concern  . Not on file  Social History Narrative  . Not on file   Social Determinants of Health   Financial Resource Strain:   . Difficulty of Paying Living Expenses: Not on file  Food Insecurity:   . Worried About Charity fundraiser in the Last Year: Not on file  . Ran Out of Food in the Last Year: Not on file  Transportation Needs:   . Lack of Transportation (Medical): Not on file  . Lack of Transportation (Non-Medical): Not on file  Physical Activity:   . Days of Exercise per Week: Not on file  . Minutes of Exercise per Session: Not on file  Stress:   . Feeling of Stress : Not on file  Social Connections:   . Frequency of Communication with Friends and Family: Not on file  . Frequency of Social Gatherings with Friends and Family: Not  on file  . Attends Religious Services: Not on file  . Active Member of Clubs or Organizations: Not on file  . Attends Archivist Meetings: Not on file  . Marital Status: Not on file  Intimate Partner Violence:   . Fear of Current or Ex-Partner: Not on file  . Emotionally Abused: Not on file  . Physically Abused: Not on file  . Sexually Abused: Not on file    Outpatient Medications Prior to Visit  Medication Sig Dispense Refill  . meloxicam (MOBIC) 15 MG tablet One tab PO qAM with a meal for 2 weeks, then daily prn pain. 30 tablet 3  . metoprolol succinate (TOPROL-XL) 25 MG 24 hr tablet Take 1 tablet (25 mg total) by mouth daily. appt for further refills 90 tablet 0  . amphetamine-dextroamphetamine (ADDERALL XR) 30 MG  24 hr capsule Take 1 capsule (30 mg total) by mouth daily. Script 3/3. Schedule appointment for refills. 30 capsule 0  . amphetamine-dextroamphetamine (ADDERALL XR) 30 MG 24 hr capsule Take 1 capsule (30 mg total) by mouth every morning. Script 2/3 30 capsule 0  . amphetamine-dextroamphetamine (ADDERALL XR) 30 MG 24 hr capsule Take 1 capsule (30 mg total) by mouth every morning. Script 1/3 30 capsule 0  . amphetamine-dextroamphetamine (ADDERALL) 30 MG tablet Take 1 tablet by mouth daily. At 2pm. Script 3/3 - Schedule appointment for refills. 30 tablet 0  . amphetamine-dextroamphetamine (ADDERALL) 30 MG tablet Take 1 tablet by mouth daily. At 2pm. Script 2/3 30 tablet 0  . amphetamine-dextroamphetamine (ADDERALL) 30 MG tablet Take 1 tablet by mouth daily. At 2pm. Script 1/3 30 tablet 0  . budesonide-formoterol (SYMBICORT) 160-4.5 MCG/ACT inhaler TAKE 2 PUFFS BY MOUTH TWICE A DAY 10.2 Inhaler 3   No facility-administered medications prior to visit.    No Known Allergies     Objective:    Physical Exam Vitals and nursing note reviewed.  Constitutional:      Appearance: Normal appearance.  HENT:     Head: Normocephalic.  Eyes:     Extraocular Movements: Extraocular movements intact.     Conjunctiva/sclera: Conjunctivae normal.     Pupils: Pupils are equal, round, and reactive to light.  Neck:     Vascular: No carotid bruit.  Cardiovascular:     Rate and Rhythm: Normal rate and regular rhythm.     Pulses: Normal pulses.     Heart sounds: Normal heart sounds.  Pulmonary:     Effort: Pulmonary effort is normal.     Breath sounds: Normal breath sounds.  Abdominal:     General: Abdomen is flat. Bowel sounds are normal.     Palpations: Abdomen is soft.  Musculoskeletal:        General: Normal range of motion.     Cervical back: Normal range of motion and neck supple.     Right lower leg: No edema.     Left lower leg: No edema.  Skin:    General: Skin is warm and dry.     Capillary  Refill: Capillary refill takes less than 2 seconds.  Neurological:     General: No focal deficit present.     Mental Status: He is alert and oriented to person, place, and time.  Psychiatric:        Mood and Affect: Mood normal.        Behavior: Behavior normal.        Thought Content: Thought content normal.        Judgment:  Judgment normal.     BP 131/85   Pulse 84   Ht 6' (1.829 m)   Wt 173 lb (78.5 kg)   SpO2 98%   BMI 23.46 kg/m  Wt Readings from Last 3 Encounters:  02/07/20 173 lb (78.5 kg)  10/31/19 181 lb 9.6 oz (82.4 kg)  07/08/19 181 lb (82.1 kg)    Health Maintenance Due  Topic Date Due  . Hepatitis C Screening  Never done  . COVID-19 Vaccine (2 - Pfizer 2-dose series) 11/16/2019  . INFLUENZA VACCINE  Never done    There are no preventive care reminders to display for this patient.   Lab Results  Component Value Date   TSH 1.320 09/13/2017   Lab Results  Component Value Date   WBC 6.8 02/26/2019   HGB 15.0 02/26/2019   HCT 43.9 02/26/2019   MCV 97.1 02/26/2019   PLT 212 02/26/2019   Lab Results  Component Value Date   NA 141 02/26/2019   K 3.9 02/26/2019   CO2 27 02/26/2019   GLUCOSE 130 (H) 02/26/2019   BUN 11 02/26/2019   CREATININE 0.98 02/26/2019   BILITOT 0.6 02/26/2019   ALKPHOS 50 08/31/2016   AST 12 02/26/2019   ALT 13 02/26/2019   PROT 6.8 02/26/2019   ALBUMIN 4.4 08/31/2016   CALCIUM 9.1 02/26/2019   Lab Results  Component Value Date   CHOL 222 (H) 05/23/2018   Lab Results  Component Value Date   HDL 48 05/23/2018   Lab Results  Component Value Date   LDLCALC 131 (H) 05/23/2018   Lab Results  Component Value Date   TRIG 278 (H) 05/23/2018   Lab Results  Component Value Date   CHOLHDL 4.6 05/23/2018   No results found for: HGBA1C     Assessment & Plan:   Problem List Items Addressed This Visit      Other   ADHD (attention deficit hyperactivity disorder), combined type    ADHD follow-up.  Medication  working well for his attention and he is tolerating without any significant side effects. Patient is taking some breaks on weekends and holidays utilizing the medication mostly while he is working. Refills provided for a.m. and p.m. dose for next 3 months. Patient to follow-up in 3 months for ADHD evaluation.  Follow-up can be as a virtual visit.      Relevant Medications   amphetamine-dextroamphetamine (ADDERALL) 30 MG tablet   amphetamine-dextroamphetamine (ADDERALL) 30 MG tablet (Start on 03/08/2020)   amphetamine-dextroamphetamine (ADDERALL) 30 MG tablet (Start on 04/07/2020)   amphetamine-dextroamphetamine (ADDERALL XR) 30 MG 24 hr capsule   amphetamine-dextroamphetamine (ADDERALL XR) 30 MG 24 hr capsule (Start on 03/08/2020)   amphetamine-dextroamphetamine (ADDERALL XR) 30 MG 24 hr capsule (Start on 04/07/2020)   Wheezing - Primary    Wheezing and cough with suspected asthma.  Symbicort used daily to help with symptoms. No concerns today. Refills provided.      Relevant Medications   budesonide-formoterol (SYMBICORT) 160-4.5 MCG/ACT inhaler       Meds ordered this encounter  Medications  . amphetamine-dextroamphetamine (ADDERALL) 30 MG tablet    Sig: Take 1 tablet by mouth daily. At 2pm. Script 1/3    Dispense:  30 tablet    Refill:  0  . amphetamine-dextroamphetamine (ADDERALL) 30 MG tablet    Sig: Take 1 tablet by mouth daily. At 2pm. Script 2/3    Dispense:  30 tablet    Refill:  0  . amphetamine-dextroamphetamine (ADDERALL) 30 MG tablet  Sig: Take 1 tablet by mouth daily. At 2pm. Script 3/3 - Schedule appointment for refills.    Dispense:  30 tablet    Refill:  0  . amphetamine-dextroamphetamine (ADDERALL XR) 30 MG 24 hr capsule    Sig: Take 1 capsule (30 mg total) by mouth every morning. Script 1/3    Dispense:  30 capsule    Refill:  0  . amphetamine-dextroamphetamine (ADDERALL XR) 30 MG 24 hr capsule    Sig: Take 1 capsule (30 mg total) by mouth every morning.  Script 2/3    Dispense:  30 capsule    Refill:  0  . amphetamine-dextroamphetamine (ADDERALL XR) 30 MG 24 hr capsule    Sig: Take 1 capsule (30 mg total) by mouth daily. Script 3/3. Schedule appointment for refills.    Dispense:  30 capsule    Refill:  0  . budesonide-formoterol (SYMBICORT) 160-4.5 MCG/ACT inhaler    Sig: TAKE 2 PUFFS BY MOUTH TWICE A DAY    Dispense:  10.2 g    Refill:  1     Orma Render, NP

## 2020-02-07 NOTE — Patient Instructions (Addendum)
I have placed orders for your refills for Adderall for the next 3 months, both doses. The first 2 prescriptions will be available today.   The second 2 prescriptions will be available at your pharmacy 10/3.   The last 2 prescriptions will be available at your pharmacy 11/2.  Please call to schedule your follow-up appointment for ADHD when you pick up the last 2 prescriptions so that we can make sure we are able to see you in time before you run out of your medication.

## 2020-02-07 NOTE — Assessment & Plan Note (Addendum)
ADHD follow-up.  Medication working well for his attention and he is tolerating without any significant side effects. Patient is taking some breaks on weekends and holidays utilizing the medication mostly while he is working. Refills provided for a.m. and p.m. dose for next 3 months. PDMP monitored today. Patient to follow-up in 3 months for ADHD evaluation.  Follow-up can be as a virtual visit.

## 2020-02-07 NOTE — Assessment & Plan Note (Signed)
Wheezing and cough with suspected asthma.  Symbicort used daily to help with symptoms. No concerns today. Refills provided.

## 2020-02-11 ENCOUNTER — Ambulatory Visit: Payer: 59 | Admitting: Physician Assistant

## 2020-02-11 ENCOUNTER — Ambulatory Visit: Payer: 59 | Admitting: Sports Medicine

## 2020-02-12 ENCOUNTER — Ambulatory Visit (INDEPENDENT_AMBULATORY_CARE_PROVIDER_SITE_OTHER): Payer: 59

## 2020-02-12 ENCOUNTER — Ambulatory Visit (INDEPENDENT_AMBULATORY_CARE_PROVIDER_SITE_OTHER): Payer: 59 | Admitting: Sports Medicine

## 2020-02-12 DIAGNOSIS — M7121 Synovial cyst of popliteal space [Baker], right knee: Secondary | ICD-10-CM

## 2020-02-12 NOTE — Progress Notes (Signed)
    Procedures performed today:    Procedure: Real-time Ultrasound Guided injection of the right knee Device: Samsung HS60  Verbal informed consent obtained.  Time-out conducted.  Noted no overlying erythema, induration, or other signs of local infection.  Skin prepped in a sterile fashion.  Local anesthesia: Topical Ethyl chloride.  With sterile technique and under real time ultrasound guidance:  30 mg/2 mL of OrthoVisc (sodium hyaluronate) in a prefilled syringe was injected easily into the knee through a 22-gauge needle.   Completed without difficulty  Pain immediately resolved suggesting accurate placement of the medication.  Advised to call if fevers/chills, erythema, induration, drainage, or persistent bleeding.  Images permanently stored and available for review in PACS.  Impression: Technically successful ultrasound guided injection.  ___________________________________________ Gwen Her. Dianah Field, M.D., ABFM., CAQSM. Primary Care and [place] Instructor of [place] of [facility] of Medicine  Independent interpretation of notes and tests performed by another provider:   None.  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 Pleas Patricia did bring [place] some [place] bourbon.    ___________________________________________ Gwen Her. Dianah Field, M.D., ABFM., CAQSM. Primary Care and [place] Instructor of [place] of [facility] of Medicine

## 2020-02-12 NOTE — Assessment & Plan Note (Signed)
Orthovisc 3 of 4 to the right knee, return in 1 week for #4 of 4. [name] did bring [place] some [place] bourbon.

## 2020-02-20 ENCOUNTER — Ambulatory Visit: Payer: 59 | Admitting: Sports Medicine

## 2020-05-11 ENCOUNTER — Other Ambulatory Visit: Payer: Self-pay

## 2020-05-11 ENCOUNTER — Ambulatory Visit (INDEPENDENT_AMBULATORY_CARE_PROVIDER_SITE_OTHER): Payer: 59 | Admitting: Physician Assistant

## 2020-05-11 VITALS — BP 136/83 | HR 88 | Ht 72.0 in | Wt 175.0 lb

## 2020-05-11 DIAGNOSIS — F902 Attention-deficit hyperactivity disorder, combined type: Secondary | ICD-10-CM | POA: Diagnosis not present

## 2020-05-11 DIAGNOSIS — J479 Bronchiectasis, uncomplicated: Secondary | ICD-10-CM | POA: Diagnosis not present

## 2020-05-11 DIAGNOSIS — J4540 Moderate persistent asthma, uncomplicated: Secondary | ICD-10-CM | POA: Diagnosis not present

## 2020-05-11 MED ORDER — AMPHETAMINE-DEXTROAMPHETAMINE 30 MG PO TABS: 30.0000 mg | ORAL_TABLET | Freq: Every day | ORAL | 0 refills | Status: DC

## 2020-05-11 MED ORDER — AMPHETAMINE-DEXTROAMPHET ER 30 MG PO CP24: 30.0000 mg | ORAL_CAPSULE | ORAL | 0 refills | Status: DC

## 2020-05-11 MED ORDER — AMPHETAMINE-DEXTROAMPHET ER 30 MG PO CP24: 30.0000 mg | ORAL_CAPSULE | Freq: Every day | ORAL | 0 refills | Status: DC

## 2020-05-11 NOTE — Progress Notes (Signed)
Subjective:    Patient ID: Jacob Cohen, male    DOB: Oct 31, 1970, 49 y.o.   MRN: [id number]  HPI  Pt is a 49 yo male with ADHD, PSVT, Asthma who presents to the clinic for medication refills.   PSVT-no problems. Doing well.   ADHD- focus is great. Energy is really bad. It has been terrible since covid infection last summer. No increase in anxiety, insomnia, palpitations.   He is seeing pulmonology and rheumatology. Currently being worked up due to PR-3 protein elevation. On Trelegy. Breathing is ok. He has a lot of SOB but it is the best it has been.    .. 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      Objective:   Physical Exam Vitals reviewed.  Constitutional:      Appearance: Normal appearance.  Cardiovascular:     Rate and Rhythm: Normal rate and regular rhythm.     Pulses: Normal pulses.     Heart sounds: Normal heart sounds.  Pulmonary:     Effort: Pulmonary effort is normal.     Breath sounds: Wheezing and rhonchi present.  Abdominal:     General: Bowel sounds are  normal.  Musculoskeletal:     Right lower leg: No edema.     Left lower leg: No edema.  Neurological:     General: No focal deficit present.     Mental Status: He is alert and oriented to person, place, and time.  Psychiatric:        Mood and Affect: Mood normal.        Behavior: Behavior normal.           Assessment & Plan:  Marland KitchenMarland KitchenCalven was seen today for adhd.  Diagnoses and all orders for this visit:  ADHD (attention deficit hyperactivity disorder), combined type -     amphetamine-dextroamphetamine (ADDERALL) 30 MG tablet; Take 1 tablet by mouth daily. At 2pm. -     amphetamine-dextroamphetamine (ADDERALL) 30 MG tablet; Take 1 tablet by mouth daily. At 2pm. -     amphetamine-dextroamphetamine (ADDERALL) 30 MG tablet; Take 1 tablet by mouth daily. At 2pm -     amphetamine-dextroamphetamine (ADDERALL XR) 30 MG 24 hr capsule; Take 1 capsule (30 mg total) by mouth every morning. -     amphetamine-dextroamphetamine (ADDERALL XR) 30 MG 24 hr capsule; Take 1 capsule (30 mg total) by mouth every morning. -     amphetamine-dextroamphetamine (ADDERALL XR) 30 MG 24 hr capsule; Take 1 capsule (30 mg total) by mouth daily.  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  Marland KitchenMarland KitchenPDMP reviewed during this encounter. [place] for 6 month follow up.   Pt is going to rheumatology and pulmonology for management.  On Trelegy.  Concerned due to PR-3 protein elevated

## 2020-05-13 ENCOUNTER — Encounter: Payer: Self-pay | Admitting: Physician Assistant

## 2020-05-13 DIAGNOSIS — J45909 Unspecified asthma, uncomplicated: Secondary | ICD-10-CM | POA: Insufficient documentation

## 2020-05-13 DIAGNOSIS — J479 Bronchiectasis, uncomplicated: Secondary | ICD-10-CM | POA: Insufficient documentation

## 2020-06-26 ENCOUNTER — Ambulatory Visit: Payer: 59 | Admitting: Sports Medicine

## 2020-06-26 ENCOUNTER — Encounter: Payer: Self-pay | Admitting: Sports Medicine

## 2020-09-02 ENCOUNTER — Encounter: Payer: Self-pay | Admitting: Physician Assistant

## 2020-09-02 ENCOUNTER — Other Ambulatory Visit: Payer: Self-pay

## 2020-09-02 ENCOUNTER — Ambulatory Visit: Payer: 59 | Admitting: Physician Assistant

## 2020-09-02 VITALS — BP 124/82 | HR 96 | Ht 72.0 in | Wt 170.0 lb

## 2020-09-02 DIAGNOSIS — R058 Other specified cough: Secondary | ICD-10-CM | POA: Insufficient documentation

## 2020-09-02 DIAGNOSIS — U099 Post covid-19 condition, unspecified: Secondary | ICD-10-CM

## 2020-09-02 DIAGNOSIS — R053 Chronic cough: Secondary | ICD-10-CM

## 2020-09-02 DIAGNOSIS — Z1211 Encounter for screening for malignant neoplasm of colon: Secondary | ICD-10-CM

## 2020-09-02 DIAGNOSIS — F902 Attention-deficit hyperactivity disorder, combined type: Secondary | ICD-10-CM | POA: Diagnosis not present

## 2020-09-02 DIAGNOSIS — Z0184 Encounter for antibody response examination: Secondary | ICD-10-CM

## 2020-09-02 MED ORDER — AMPHETAMINE-DEXTROAMPHET ER 30 MG PO CP24: 30.0000 mg | ORAL_CAPSULE | ORAL | 0 refills | Status: AC

## 2020-09-02 MED ORDER — AMPHETAMINE-DEXTROAMPHET ER 30 MG PO CP24: 30.0000 mg | ORAL_CAPSULE | ORAL | 0 refills | Status: DC

## 2020-09-02 MED ORDER — AMPHETAMINE-DEXTROAMPHETAMINE 30 MG PO TABS: 30.0000 mg | ORAL_TABLET | Freq: Every day | ORAL | 0 refills | Status: AC

## 2020-09-02 MED ORDER — AMPHETAMINE-DEXTROAMPHETAMINE 30 MG PO TABS
30.0000 mg | ORAL_TABLET | Freq: Every day | ORAL | 0 refills | Status: DC
Start: 2020-10-02 — End: 2020-09-30

## 2020-09-02 MED ORDER — AMPHETAMINE-DEXTROAMPHET ER 30 MG PO CP24: 30.0000 mg | ORAL_CAPSULE | Freq: Every day | ORAL | 0 refills | Status: AC

## 2020-09-02 NOTE — Progress Notes (Signed)
Subjective:    Patient ID: Jacob Cohen, male    DOB: [birth year], 50 y.o.   MRN: [id number]  HPI  Patient is a 50 year old male with ADHD and chronic productive cough who presents to the clinic for follow-up and medication refill.  Patient is doing well on his Adderall.  He has no concerns or complaints.  He did get just get a new job.  He does need some immunity status testing.  He requests that today.  He denies any significant anxiety, palpitations, insomnia.  He continues to have productive chronic cough.  He is short of breath frequently.  He is seen pulmonology, allergy, rheumatology with little answers.  This is all been since his Covid 19 infection.  He is on Trelegy with some benefit but no resolution.  He is being referred to cardiology at this point. No CP. Not able to exercise.    .. Active Ambulatory Problems    Diagnosis Date Noted  . Lipoma of torso 07/06/2016  . ADHD (attention deficit hyperactivity disorder), combined type 07/06/2016  . Palpitations 07/06/2016  . History of PSVT (paroxysmal supraventricular tachycardia) 07/06/2016  . Heart disease   . Tinea versicolor 12/08/2017  . 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 Polyarthralgia 07/02/2018  . Left shoulder pain 07/02/2018  . Chronic right SI joint pain 01/30/2019  . 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 Weakness 02/04/2019  . COVID-19 long hauler manifesting chronic cough 09/04/2019  . Wheezing 02/07/2020  . Bronchiectasis without complication ([place]) 74/01/1447  . Asthma in adult 05/13/2020  . Productive cough 09/02/2020   Resolved Ambulatory Problems    Diagnosis Date Noted  . No Resolved Ambulatory Problems   Past Medical History:  Diagnosis Date  . ADHD   . Paroxysmal SVT (supraventricular tachycardia) ([place])   . Syncope      Review of Systems  All other systems reviewed and are negative.      Objective:   Physical Exam Vitals reviewed.  Constitutional:       Appearance: Normal appearance.  Neck:     Vascular: No carotid bruit.  Cardiovascular:     Rate and Rhythm: Normal rate and regular rhythm.     Pulses: Normal pulses.     Heart sounds: Normal heart sounds.  Pulmonary:     Effort: Pulmonary effort is normal.     Comments: Bilateral lung wheezing/rales/rhonchi Musculoskeletal:     Right lower leg: No edema.     Left lower leg: No edema.  Neurological:     General: No focal deficit present.     Mental Status: He is alert and oriented to person, place, and time.  Psychiatric:        Mood and Affect: Mood normal.           Assessment & Plan:  Marland KitchenMarland KitchenKeonta was seen today for adhd.  Diagnoses and all orders for this visit:  ADHD (attention deficit hyperactivity disorder), combined type -     amphetamine-dextroamphetamine (ADDERALL) 30 MG tablet; Take 1 tablet by mouth daily. At 2pm. -     amphetamine-dextroamphetamine (ADDERALL) 30 MG tablet; Take 1 tablet by mouth daily. At 2pm. -     amphetamine-dextroamphetamine (ADDERALL) 30 MG tablet; Take 1 tablet by mouth daily. At 2pm -     amphetamine-dextroamphetamine (ADDERALL XR) 30 MG 24 hr capsule; Take 1 capsule (30 mg total) by mouth every morning. -     amphetamine-dextroamphetamine (ADDERALL XR) 30 MG 24 hr capsule; Take  1 capsule (30 mg total) by mouth every morning. -     amphetamine-dextroamphetamine (ADDERALL XR) 30 MG 24 hr capsule; Take 1 capsule (30 mg total) by mouth daily.  Immunity status testing -     QuantiFERON-TB Gold Plus -     Measles/Mumps/Rubella Immunity -     Varicella zoster antibody, IgG -     Hepatitis B surface antibody,quantitative  Colon cancer screening -     Ambulatory referral to Gastroenterology  Chronic cough  Productive cough  COVID-19 long hauler manifesting chronic cough   Immunity status testing was ordered. Refilled Adderall for 3 months.  Continue to follow-up with pulmonology, cardiology.  His lungs do not sound great today.   Likely need to repeat CT scan. Vitals great today and reassuring.   Screening for colon cancer with colonoscopy ordered today.

## 2020-09-04 LAB — MEASLES/MUMPS/RUBELLA IMMUNITY
Mumps IgG: 102 AU/mL
Rubella: 32.1 Index
Rubeola IgG: >300 AU/mL

## 2020-09-04 LAB — HEPATITIS B SURFACE ANTIBODY, QUANTITATIVE: Hep B S AB Quant (Post): >1000 m[IU]/mL (ref >=10)

## 2020-09-04 LAB — QUANTIFERON-TB GOLD PLUS
Mitogen-NIL: >10 IU/mL
NIL: 0.02 IU/mL
QuantiFERON-TB Gold Plus: NEGATIVE
TB1-NIL: 0.02 IU/mL
TB2-NIL: 0.01 IU/mL

## 2020-09-04 LAB — VARICELLA ZOSTER ANTIBODY, IGG: Varicella IgG: 557 index

## 2020-09-04 NOTE — Progress Notes (Signed)
TB screening negative

## 2020-09-04 NOTE — Progress Notes (Signed)
Immunity to hep B, MMR, Varicella confirmed.

## 2020-09-30 ENCOUNTER — Other Ambulatory Visit: Payer: Self-pay

## 2020-09-30 DIAGNOSIS — F902 Attention-deficit hyperactivity disorder, combined type: Secondary | ICD-10-CM

## 2020-09-30 MED ORDER — AMPHETAMINE-DEXTROAMPHET ER 30 MG PO CP24: 30.0000 mg | ORAL_CAPSULE | ORAL | 0 refills | Status: AC

## 2020-09-30 MED ORDER — AMPHETAMINE-DEXTROAMPHETAMINE 30 MG PO TABS
30.0000 mg | ORAL_TABLET | Freq: Every day | ORAL | 0 refills | Status: AC
Start: 2020-10-02 — End: ?

## 2020-09-30 NOTE — Telephone Encounter (Signed)
Jacob Cohen called and said he has recently moved and asked for his Adderall prescriptions at  be sent to a new pharmacy. Called and canceled Rx at [facility]. April and May refills pended with new pharmacy information.

## 2020-09-30 NOTE — Telephone Encounter (Signed)
Jacob Cohen has moved to [place]. He has not had time to get a new PCP. He needs [facility] [place]. Pended prescriptions.

## 2020-10-29 ENCOUNTER — Telehealth: Payer: Self-pay | Admitting: *Deleted

## 2020-10-29 NOTE — Telephone Encounter (Signed)
Patient no showed PV today- Called patient and left message to return call by 5 pm today- If no call by 5 pm, PV and procedure will be canceled - no call at 5 pm -  PV and Procedure both canceled- No Show letter mailed to patient  

## 2020-11-09 ENCOUNTER — Ambulatory Visit: Payer: 59 | Admitting: Physician Assistant

## 2020-11-09 DIAGNOSIS — Z13220 Encounter for screening for lipoid disorders: Secondary | ICD-10-CM

## 2020-11-09 DIAGNOSIS — Z131 Encounter for screening for diabetes mellitus: Secondary | ICD-10-CM

## 2020-11-09 DIAGNOSIS — F902 Attention-deficit hyperactivity disorder, combined type: Secondary | ICD-10-CM

## 2020-11-09 DIAGNOSIS — Z1329 Encounter for screening for other suspected endocrine disorder: Secondary | ICD-10-CM

## 2020-11-09 DIAGNOSIS — Z79899 Other long term (current) drug therapy: Secondary | ICD-10-CM

## 2020-11-13 ENCOUNTER — Encounter: Payer: 59 | Admitting: Gastroenterology

## 2020-11-23 ENCOUNTER — Telehealth: Payer: Self-pay | Admitting: Neurology

## 2020-11-23 NOTE — Telephone Encounter (Signed)
Make sure he has albuterol for rescue but will not do what trelegy will do. How long out of insurance? Ok to give him the one and maybe we could get him a few more until he gets his insurance.

## 2020-11-23 NOTE — Telephone Encounter (Signed)
Patient left vm, he is between jobs and has not insurance coverage at the moment. He can not afford Trelegy (over $600). He wants to know if you would write him for albuterol different affordable inhaler?   (I believe we may have one Trelegy sample left, okay to give to patient?)   [phone]

## 2020-11-23 NOTE — Telephone Encounter (Signed)
LMOM for patient to call back.

## 2020-11-24 NOTE — Telephone Encounter (Signed)
Left message on machine for patient to call back to discuss meds.

## 2020-11-27 MED ORDER — ALBUTEROL SULFATE HFA 108 (90 BASE) MCG/ACT IN AERS: 2.0000 | INHALATION_SPRAY | Freq: Four times a day (QID) | RESPIRATORY_TRACT | 0 refills | Status: AC | PRN

## 2020-11-27 NOTE — Addendum Note (Signed)
Addended byAnnamaria Helling on: 11/27/2020 02:17 PM   Modules accepted: Orders

## 2020-11-27 NOTE — Telephone Encounter (Signed)
Unable to reach patient, mychart message sent

## 2020-11-27 NOTE — Telephone Encounter (Signed)
Patient left another vm. He was out of town and didn't have service. He has actually moved to [place] and is going to establish care there. He is currently looking for a job. Aware we can send one Albuterol inhaler, but he is going to establish care in [place]. Has appt with Pulmonology in 3 weeks. Orin.

## 2020-12-23 ENCOUNTER — Other Ambulatory Visit: Payer: Self-pay | Admitting: Physician Assistant

## 2022-04-15 IMAGING — DX DG KNEE 1-2V*L*
2 series · 2 of 2 positions shown · non-contrast
Comparison: 05/28/2018

CLINICAL DATA: Knee pain

EXAM:
LEFT KNEE - 1-2 VIEW

[tunnel]
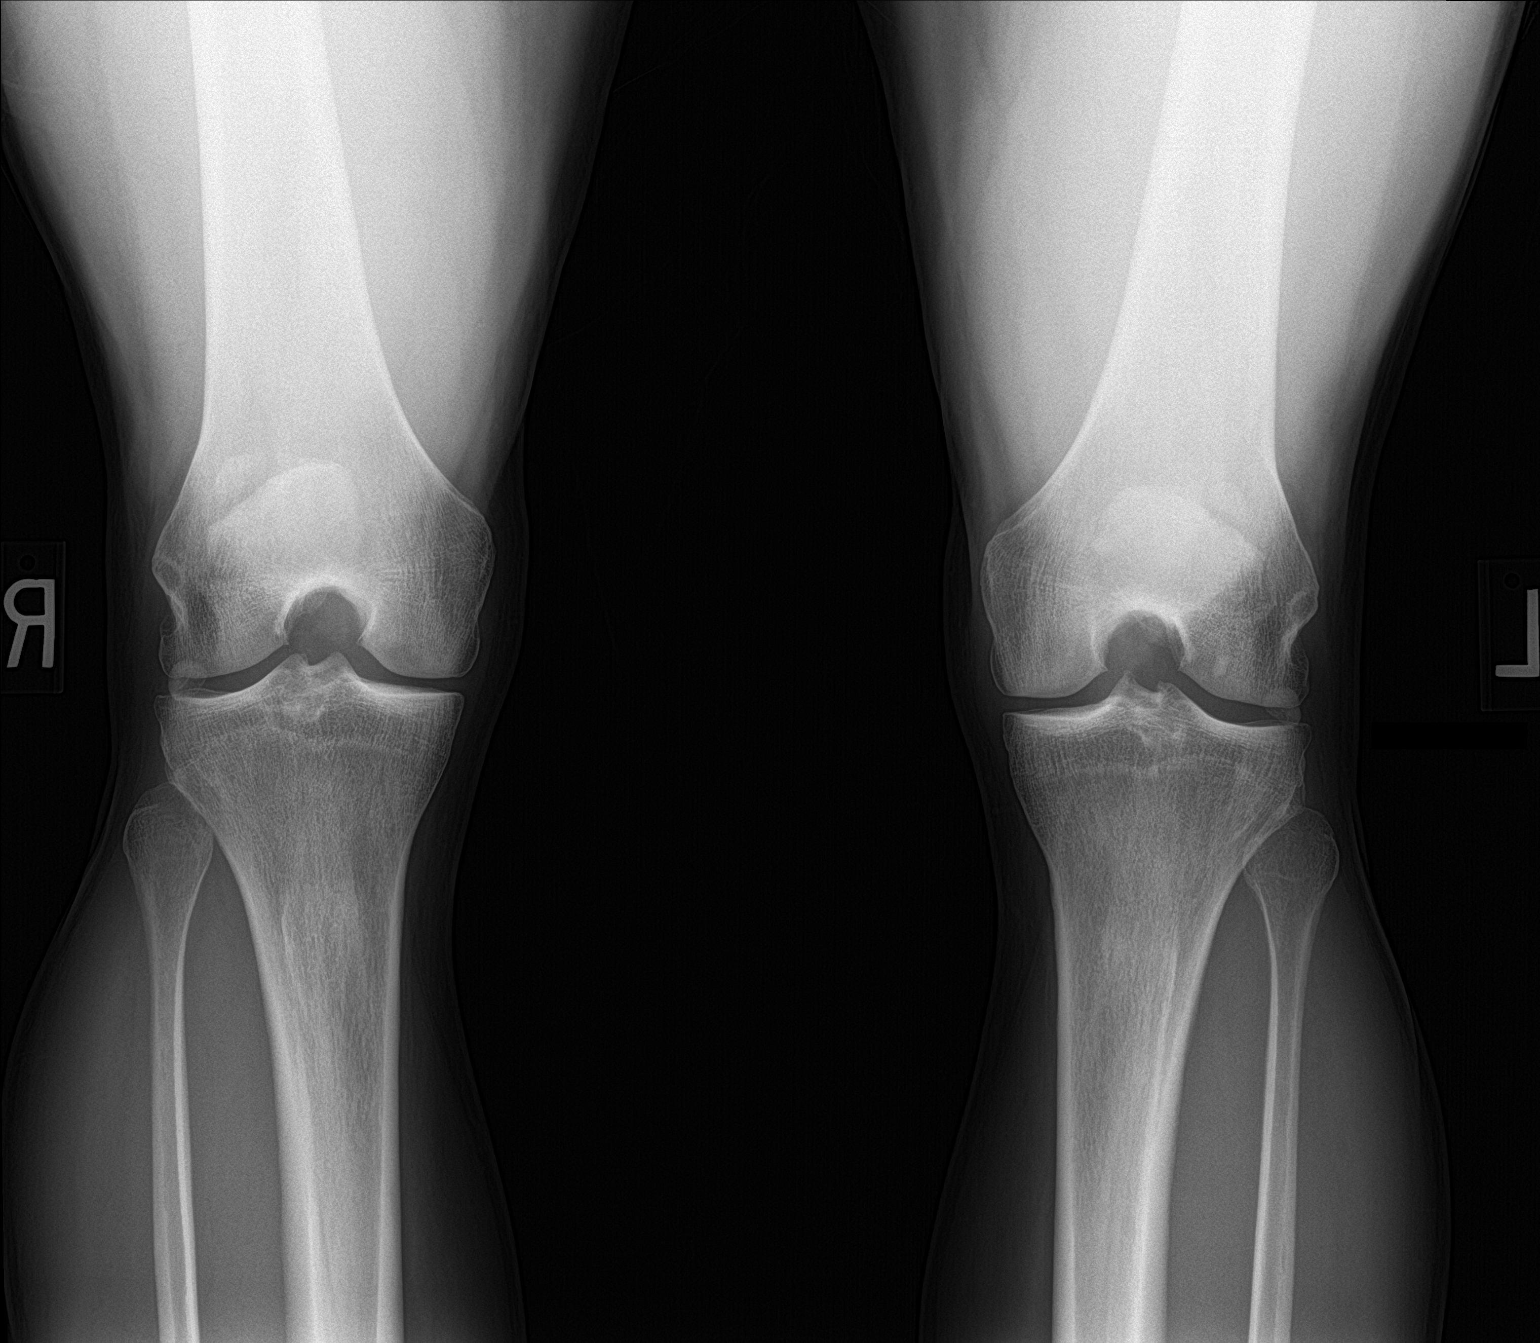

[knee ap bilat standing]
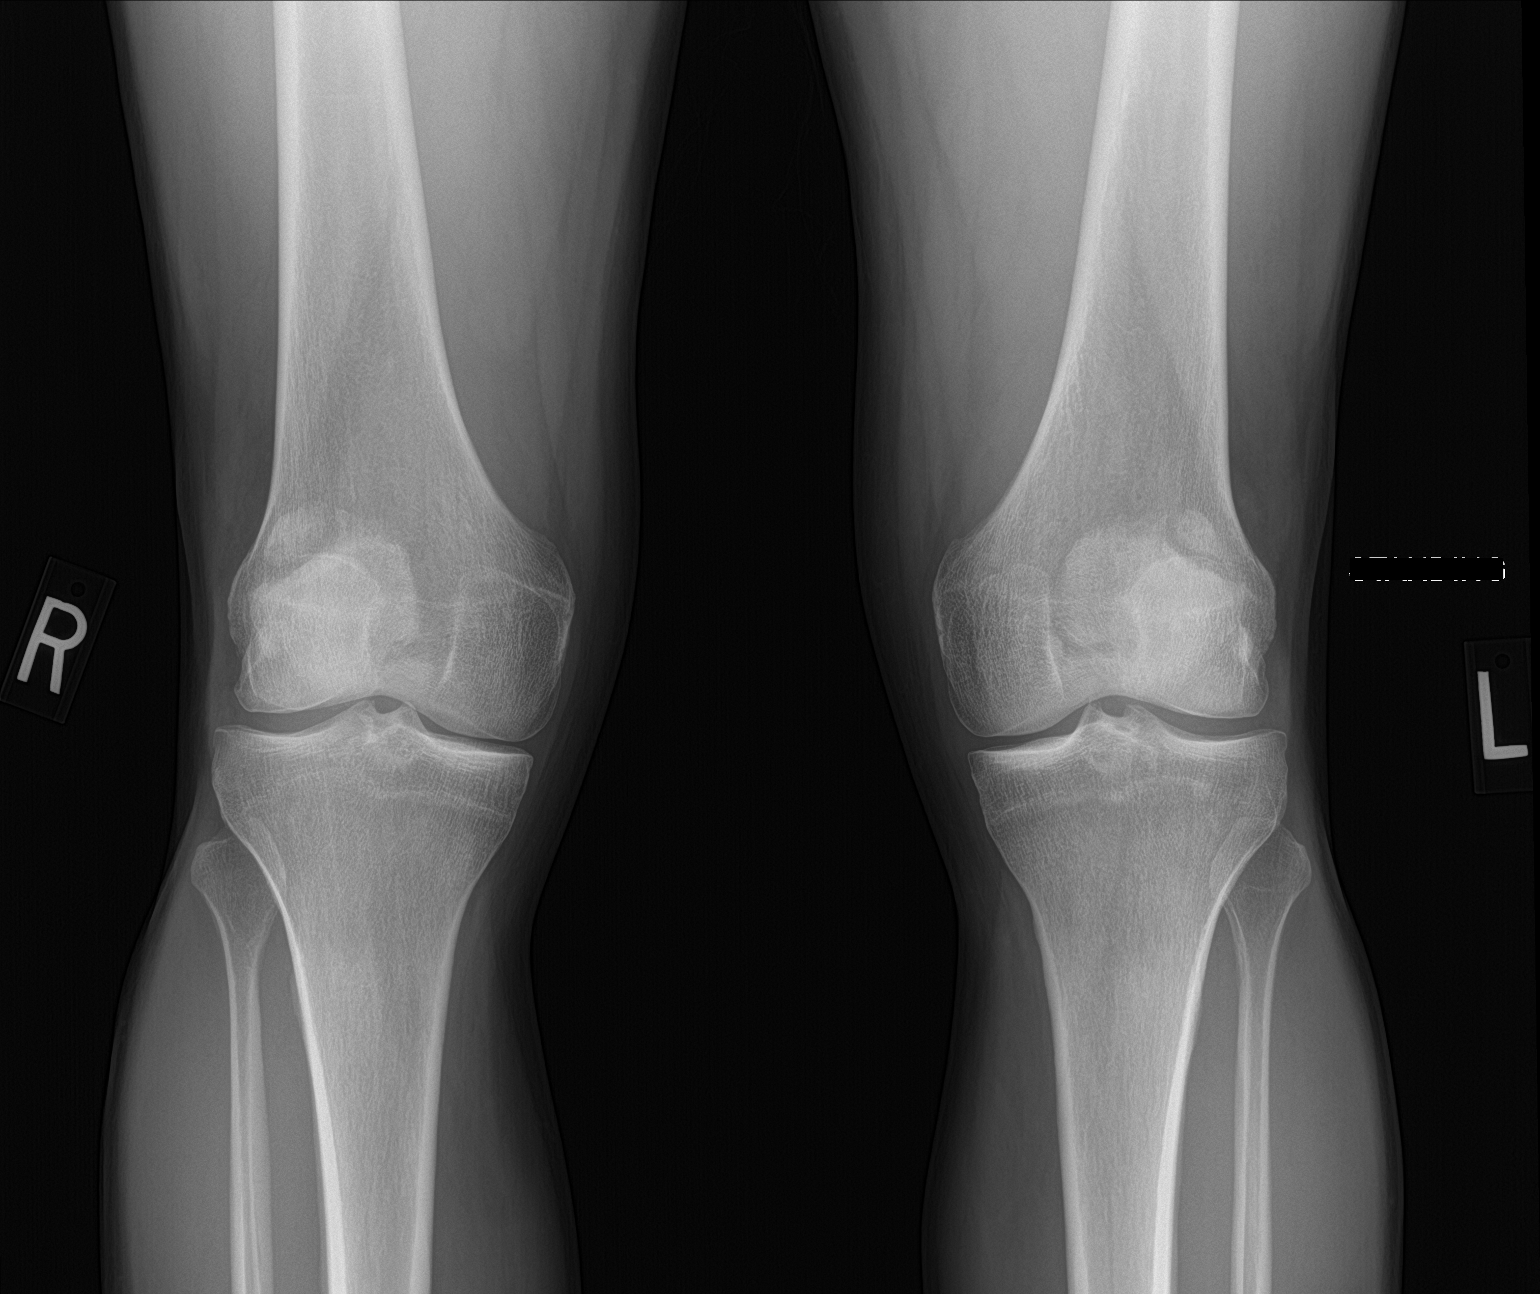

[2 of 2 positions shown; findings below may reference images not displayed]

FINDINGS: No fracture or malalignment.  Minimal medial joint space narrowing.
IMPRESSION: Minimal medial joint space narrowing.

## 2022-04-15 IMAGING — DX DG KNEE COMPLETE 4+V*R*
4 series · 4 of 4 positions shown · non-contrast
Comparison: MRI 07/16/2018, radiograph 05/28/2018

CLINICAL DATA: Right knee pain

EXAM:
RIGHT KNEE - COMPLETE 4+ VIEW

[tunnel]
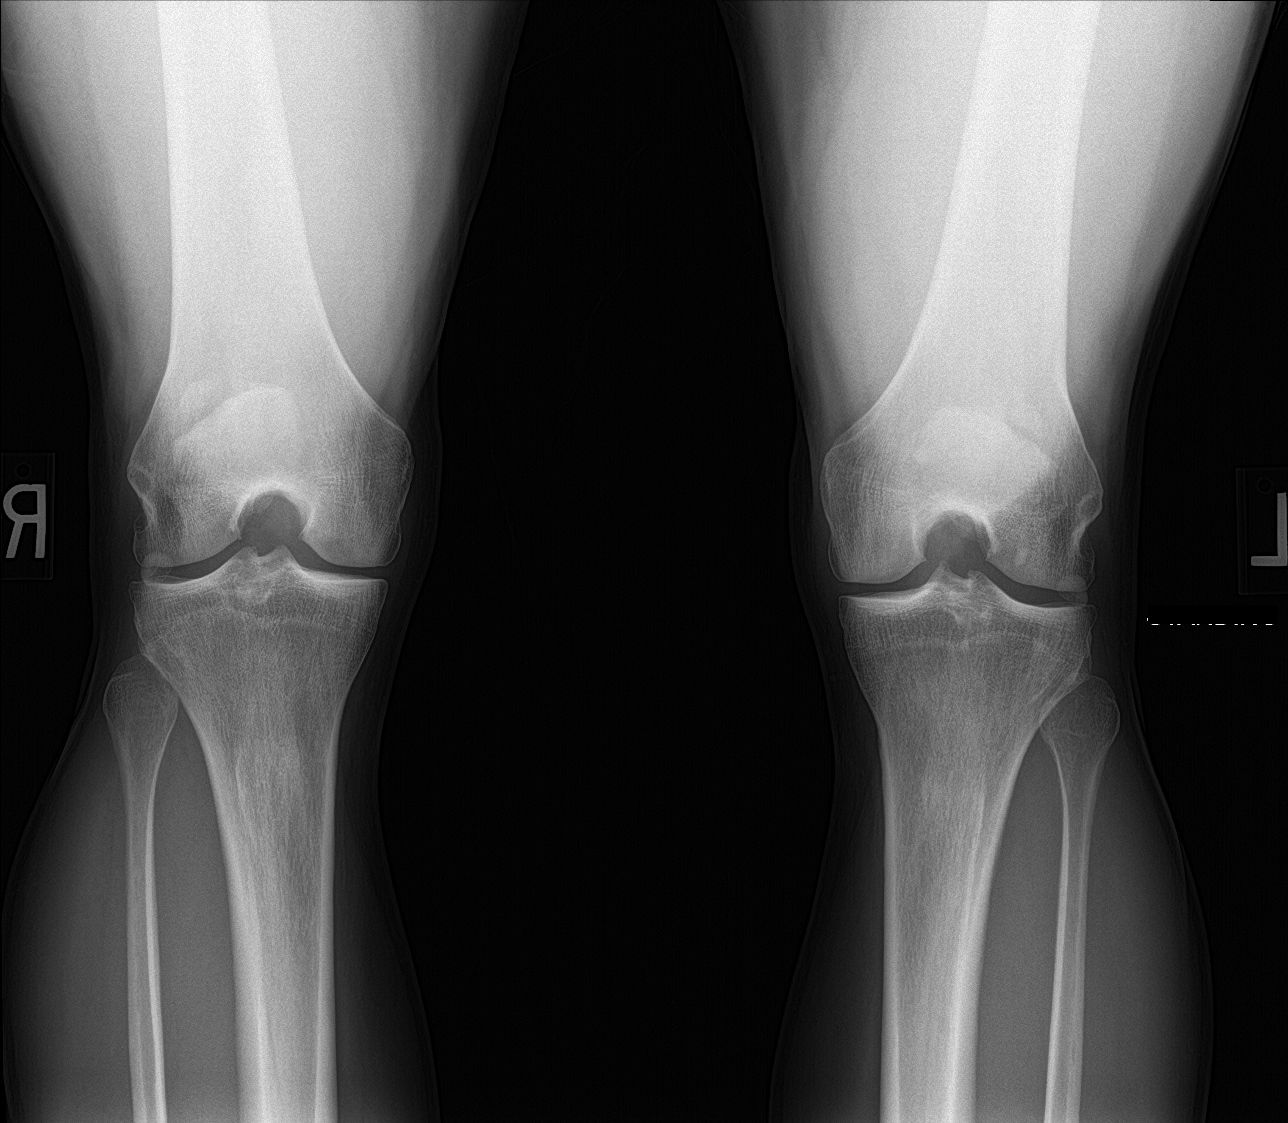

[knee lat]
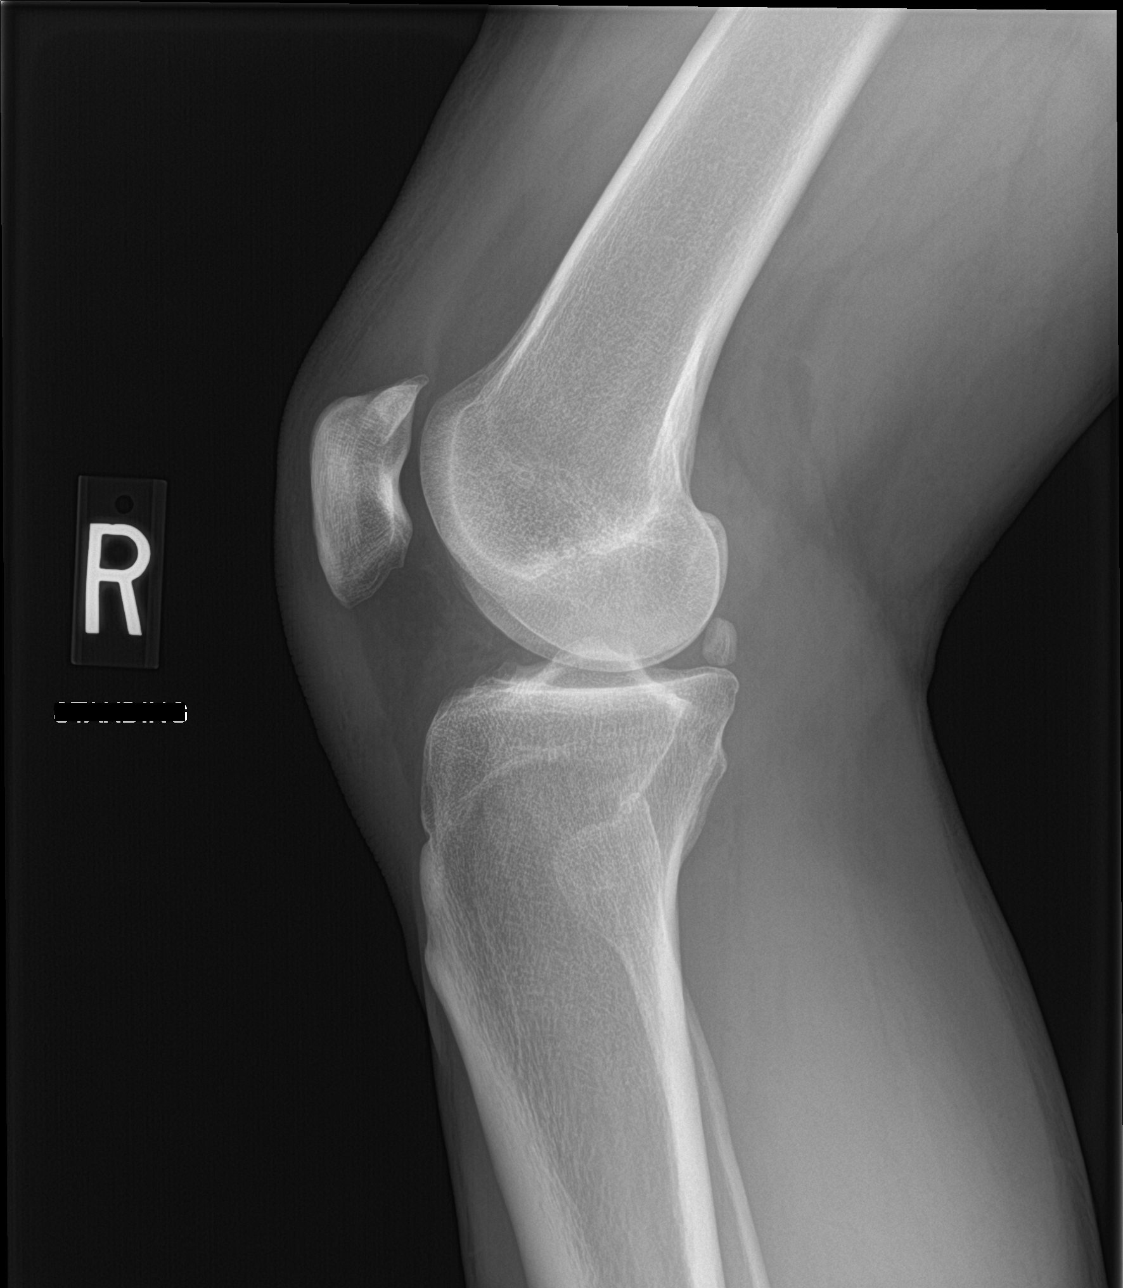

[knee sunrise]
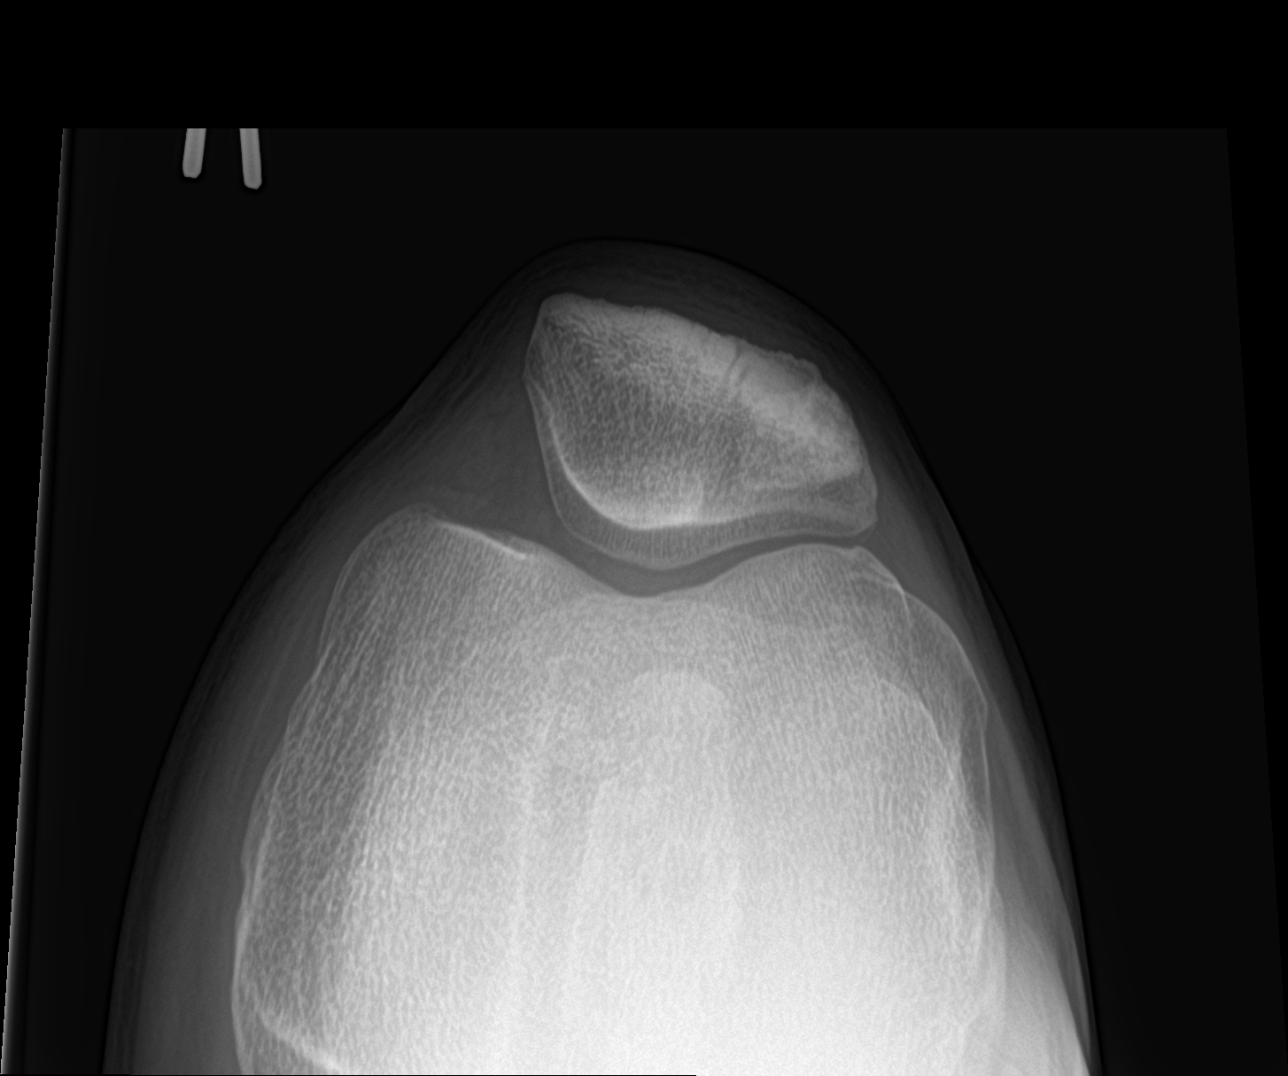

[knee ap bilat standing]
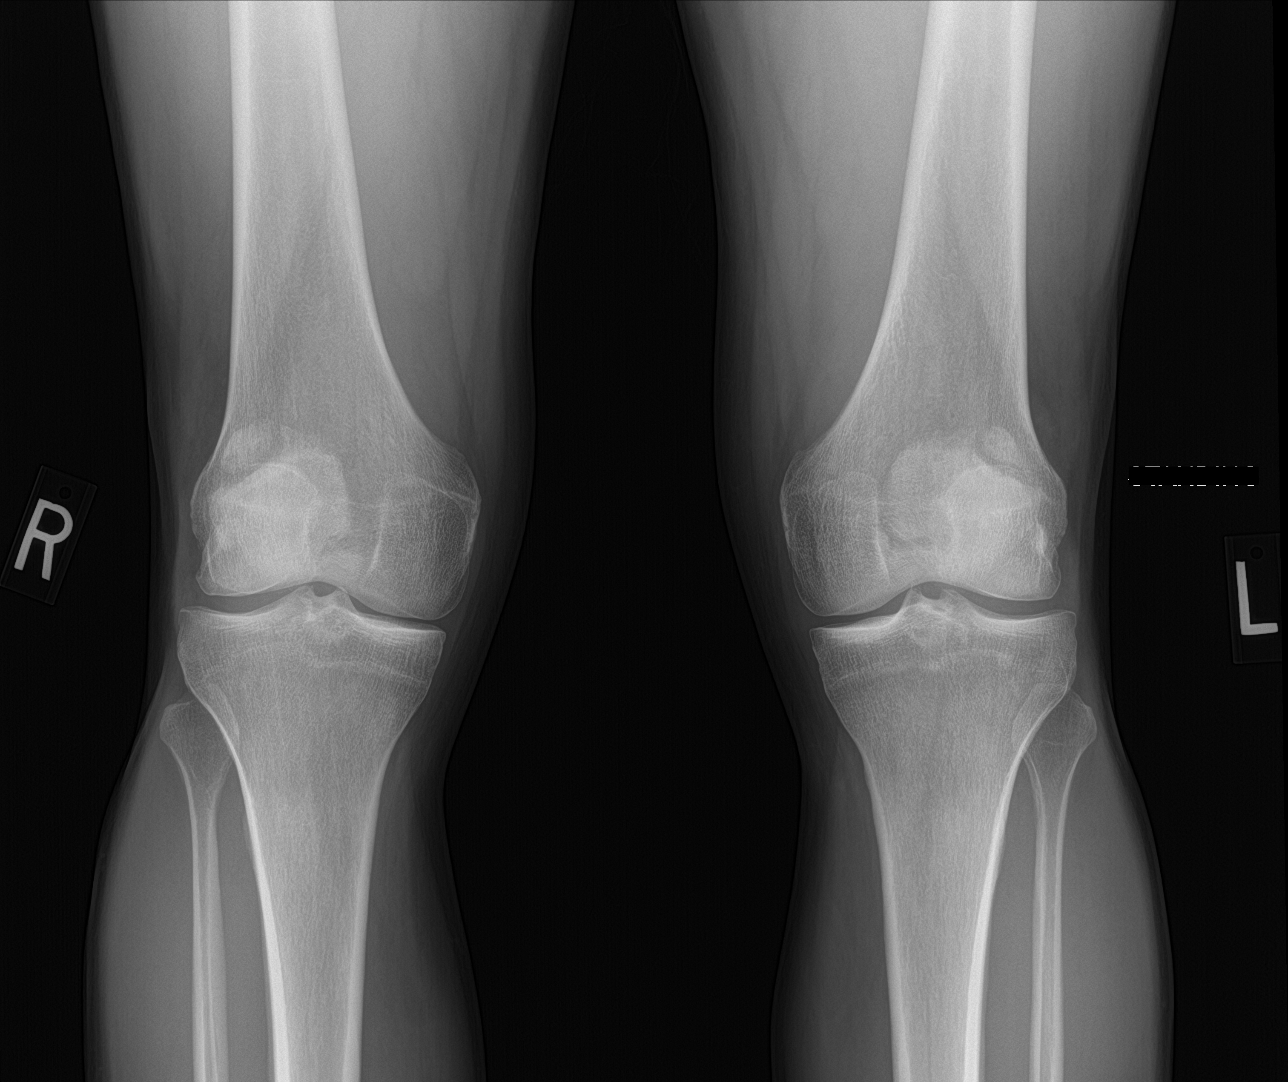

[4 of 4 positions shown; findings below may reference images not displayed]

FINDINGS: No fracture or malalignment. Bipartite patella. Minimal narrowing of
the medial joint space no large knee effusion.
IMPRESSION: No acute osseous abnormality. Mild medial joint space degenerative
change
